# Patient Record
Sex: Female | Born: 1985 | Race: Black or African American | Hispanic: No | Marital: Married | Smoking: Never smoker
Health system: Southern US, Community
[De-identification: ages and names within clinical notes are randomized; demographics above are authoritative.]

## PROBLEM LIST (undated history)

## (undated) ENCOUNTER — Inpatient Hospital Stay (HOSPITAL_COMMUNITY): Payer: Self-pay

## (undated) DIAGNOSIS — R519 Headache, unspecified: Secondary | ICD-10-CM

## (undated) DIAGNOSIS — R51 Headache: Secondary | ICD-10-CM

## (undated) DIAGNOSIS — J069 Acute upper respiratory infection, unspecified: Secondary | ICD-10-CM

## (undated) DIAGNOSIS — O139 Gestational [pregnancy-induced] hypertension without significant proteinuria, unspecified trimester: Secondary | ICD-10-CM

## (undated) HISTORY — DX: Headache, unspecified: R51.9

## (undated) HISTORY — DX: Headache: R51

## (undated) HISTORY — PX: NO PAST SURGERIES: SHX2092

## (undated) HISTORY — DX: Acute upper respiratory infection, unspecified: J06.9

---

## 2014-09-30 ENCOUNTER — Other Ambulatory Visit: Payer: Self-pay | Admitting: Family Medicine

## 2014-09-30 DIAGNOSIS — R1011 Right upper quadrant pain: Secondary | ICD-10-CM

## 2014-10-07 ENCOUNTER — Ambulatory Visit
Admission: RE | Admit: 2014-10-07 | Discharge: 2014-10-07 | Disposition: A | Discharge status: Home / Self Care | Payer: BLUE CROSS/BLUE SHIELD | Source: Ambulatory Visit | Attending: Family Medicine | Admitting: Family Medicine

## 2014-10-07 DIAGNOSIS — R1011 Right upper quadrant pain: Secondary | ICD-10-CM

## 2016-05-09 NOTE — L&D Delivery Note (Signed)
Patient is 31 y.o. G1P1001 [redacted]w[redacted]d admitted for IOL for gHTN. S/p IOL with foley bulb, cytotec, followed by Pitocin. SROM at 0800.  Delivery Note At 1:26 PM a viable female was delivered via Vaginal, Spontaneous Delivery (Presentation: OA ).  APGAR: 4, 7; weight 6 lb 14.2 oz (3125 g).   Placenta status: Intact .  Cord: 3V  with the following complications: None.  Cord pH: 7.2.  Anesthesia: Epidural  Episiotomy: None Lacerations: None Est. Blood Loss (mL): 150  Mom to postpartum.  Baby to Couplet care / Skin to Skin.  Upon arrival patient was complete and pushing. She pushed with good maternal effort to deliver a viable infant. No nuchal cord present. Baby delivered without difficulty. Was noted to have poor tone, color,a nd decreased respirations. Was taken over to warmer for drying and stimulation. Placenta delivered spontaneously with gentle cord traction. Fundus firm with massage and Pitocin. Perineum inspected and found to be hemostatic. Counts of sharps, instruments, and lap pads were all correct.   Caryl Ada, DO OB Fellow 01/27/2017, 1:53 PM

## 2016-07-07 ENCOUNTER — Other Ambulatory Visit (HOSPITAL_COMMUNITY)
Admission: RE | Admit: 2016-07-07 | Discharge: 2016-07-07 | Disposition: A | Discharge status: Home / Self Care | Payer: Medicaid Other | Source: Ambulatory Visit | Attending: Obstetrics and Gynecology | Admitting: Obstetrics and Gynecology

## 2016-07-07 ENCOUNTER — Encounter: Payer: Self-pay | Admitting: Obstetrics and Gynecology

## 2016-07-07 ENCOUNTER — Ambulatory Visit (INDEPENDENT_AMBULATORY_CARE_PROVIDER_SITE_OTHER): Payer: Medicaid Other | Admitting: Obstetrics and Gynecology

## 2016-07-07 DIAGNOSIS — Z113 Encounter for screening for infections with a predominantly sexual mode of transmission: Secondary | ICD-10-CM | POA: Diagnosis present

## 2016-07-07 DIAGNOSIS — Z01419 Encounter for gynecological examination (general) (routine) without abnormal findings: Secondary | ICD-10-CM | POA: Diagnosis not present

## 2016-07-07 DIAGNOSIS — Z3401 Encounter for supervision of normal first pregnancy, first trimester: Secondary | ICD-10-CM

## 2016-07-07 NOTE — Patient Instructions (Signed)
First Trimester of Pregnancy The first trimester of pregnancy is from week 1 until the end of week 13 (months 1 through 3). A week after a sperm fertilizes an egg, the egg will implant on the wall of the uterus. This embryo will begin to develop into a baby. Genes from you and your partner will form the baby. The female genes will determine whether the baby will be a boy or a girl. At 6-8 weeks, the eyes and face will be formed, and the heartbeat can be seen on ultrasound. At the end of 12 weeks, all the baby's organs will be formed. Now that you are pregnant, you will want to do everything you can to have a healthy baby. Two of the most important things are to get good prenatal care and to follow your health care provider's instructions. Prenatal care is all the medical care you receive before the baby's birth. This care will help prevent, find, and treat any problems during the pregnancy and childbirth. Body changes during your first trimester Your body goes through many changes during pregnancy. The changes vary from woman to woman.  You may gain or lose a couple of pounds at first.  You may feel sick to your stomach (nauseous) and you may throw up (vomit). If the vomiting is uncontrollable, call your health care provider.  You may tire easily.  You may develop headaches that can be relieved by medicines. All medicines should be approved by your health care provider.  You may urinate more often. Painful urination may mean you have a bladder infection.  You may develop heartburn as a result of your pregnancy.  You may develop constipation because certain hormones are causing the muscles that push stool through your intestines to slow down.  You may develop hemorrhoids or swollen veins (varicose veins).  Your breasts may begin to grow larger and become tender. Your nipples may stick out more, and the tissue that surrounds them (areola) may become darker.  Your gums may bleed and may be  sensitive to brushing and flossing.  Dark spots or blotches (chloasma, mask of pregnancy) may develop on your face. This will likely fade after the baby is born.  Your menstrual periods will stop.  You may have a loss of appetite.  You may develop cravings for certain kinds of food.  You may have changes in your emotions from day to day, such as being excited to be pregnant or being concerned that something may go wrong with the pregnancy and baby.  You may have more vivid and strange dreams.  You may have changes in your hair. These can include thickening of your hair, rapid growth, and changes in texture. Some women also have hair loss during or after pregnancy, or hair that feels dry or thin. Your hair will most likely return to normal after your baby is born.  What to expect at prenatal visits During a routine prenatal visit:  You will be weighed to make sure you and the baby are growing normally.  Your blood pressure will be taken.  Your abdomen will be measured to track your baby's growth.  The fetal heartbeat will be listened to between weeks 10 and 14 of your pregnancy.  Test results from any previous visits will be discussed.  Your health care provider may ask you:  How you are feeling.  If you are feeling the baby move.  If you have had any abnormal symptoms, such as leaking fluid, bleeding, severe headaches,   or abdominal cramping.  If you are using any tobacco products, including cigarettes, chewing tobacco, and electronic cigarettes.  If you have any questions.  Other tests that may be performed during your first trimester include:  Blood tests to find your blood type and to check for the presence of any previous infections. The tests will also be used to check for low iron levels (anemia) and protein on red blood cells (Rh antibodies). Depending on your risk factors, or if you previously had diabetes during pregnancy, you may have tests to check for high blood  sugar that affects pregnant women (gestational diabetes).  Urine tests to check for infections, diabetes, or protein in the urine.  An ultrasound to confirm the proper growth and development of the baby.  Fetal screens for spinal cord problems (spina bifida) and Down syndrome.  HIV (human immunodeficiency virus) testing. Routine prenatal testing includes screening for HIV, unless you choose not to have this test.  You may need other tests to make sure you and the baby are doing well.  Follow these instructions at home: Medicines  Follow your health care provider's instructions regarding medicine use. Specific medicines may be either safe or unsafe to take during pregnancy.  Take a prenatal vitamin that contains at least 600 micrograms (mcg) of folic acid.  If you develop constipation, try taking a stool softener if your health care provider approves. Eating and drinking  Eat a balanced diet that includes fresh fruits and vegetables, whole grains, good sources of protein such as meat, eggs, or tofu, and low-fat dairy. Your health care provider will help you determine the amount of weight gain that is right for you.  Avoid raw meat and uncooked cheese. These carry germs that can cause birth defects in the baby.  Eating four or five small meals rather than three large meals a day may help relieve nausea and vomiting. If you start to feel nauseous, eating a few soda crackers can be helpful. Drinking liquids between meals, instead of during meals, also seems to help ease nausea and vomiting.  Limit foods that are high in fat and processed sugars, such as fried and sweet foods.  To prevent constipation: ? Eat foods that are high in fiber, such as fresh fruits and vegetables, whole grains, and beans. ? Drink enough fluid to keep your urine clear or pale yellow. Activity  Exercise only as directed by your health care provider. Most women can continue their usual exercise routine during  pregnancy. Try to exercise for 30 minutes at least 5 days a week. Exercising will help you: ? Control your weight. ? Stay in shape. ? Be prepared for labor and delivery.  Experiencing pain or cramping in the lower abdomen or lower back is a good sign that you should stop exercising. Check with your health care provider before continuing with normal exercises.  Try to avoid standing for long periods of time. Move your legs often if you must stand in one place for a long time.  Avoid heavy lifting.  Wear low-heeled shoes and practice good posture.  You may continue to have sex unless your health care provider tells you not to. Relieving pain and discomfort  Wear a good support bra to relieve breast tenderness.  Take warm sitz baths to soothe any pain or discomfort caused by hemorrhoids. Use hemorrhoid cream if your health care provider approves.  Rest with your legs elevated if you have leg cramps or low back pain.  If you develop   varicose veins in your legs, wear support hose. Elevate your feet for 15 minutes, 3-4 times a day. Limit salt in your diet. Prenatal care  Schedule your prenatal visits by the twelfth week of pregnancy. They are usually scheduled monthly at first, then more often in the last 2 months before delivery.  Write down your questions. Take them to your prenatal visits.  Keep all your prenatal visits as told by your health care provider. This is important. Safety  Wear your seat belt at all times when driving.  Make a list of emergency phone numbers, including numbers for family, friends, the hospital, and police and fire departments. General instructions  Ask your health care provider for a referral to a local prenatal education class. Begin classes no later than the beginning of month 6 of your pregnancy.  Ask for help if you have counseling or nutritional needs during pregnancy. Your health care provider can offer advice or refer you to specialists for help  with various needs.  Do not use hot tubs, steam rooms, or saunas.  Do not douche or use tampons or scented sanitary pads.  Do not cross your legs for long periods of time.  Avoid cat litter boxes and soil used by cats. These carry germs that can cause birth defects in the baby and possibly loss of the fetus by miscarriage or stillbirth.  Avoid all smoking, herbs, alcohol, and medicines not prescribed by your health care provider. Chemicals in these products affect the formation and growth of the baby.  Do not use any products that contain nicotine or tobacco, such as cigarettes and e-cigarettes. If you need help quitting, ask your health care provider. You may receive counseling support and other resources to help you quit.  Schedule a dentist appointment. At home, brush your teeth with a soft toothbrush and be gentle when you floss. Contact a health care provider if:  You have dizziness.  You have mild pelvic cramps, pelvic pressure, or nagging pain in the abdominal area.  You have persistent nausea, vomiting, or diarrhea.  You have a bad smelling vaginal discharge.  You have pain when you urinate.  You notice increased swelling in your face, hands, legs, or ankles.  You are exposed to fifth disease or chickenpox.  You are exposed to German measles (rubella) and have never had it. Get help right away if:  You have a fever.  You are leaking fluid from your vagina.  You have spotting or bleeding from your vagina.  You have severe abdominal cramping or pain.  You have rapid weight gain or loss.  You vomit blood or material that looks like coffee grounds.  You develop a severe headache.  You have shortness of breath.  You have any kind of trauma, such as from a fall or a car accident. Summary  The first trimester of pregnancy is from week 1 until the end of week 13 (months 1 through 3).  Your body goes through many changes during pregnancy. The changes vary from  woman to woman.  You will have routine prenatal visits. During those visits, your health care provider will examine you, discuss any test results you may have, and talk with you about how you are feeling. This information is not intended to replace advice given to you by your health care provider. Make sure you discuss any questions you have with your health care provider. Document Released: 04/19/2001 Document Revised: 04/06/2016 Document Reviewed: 04/06/2016 Elsevier Interactive Patient Education  2017 [employer]   [employer]  

## 2016-07-07 NOTE — Progress Notes (Signed)
Subjective:  Cassandra Avery is a 31 y.o. G1P0 at 875w4d being seen today for her first OB visit. She has no complaints today. She denies any chronic medical problems. This is her first pregnancy. She is currently monitored for the following issues for this low-risk pregnancy and has Supervision of normal first pregnancy in first trimester on her problem list.  Patient reports no complaints.  Contractions: Not present. Vag. Bleeding: None.   . Denies leaking of fluid.   The following portions of the patient's history were reviewed and updated as appropriate: allergies, current medications, past family history, past medical history, past social history, past surgical history and problem list. Problem list updated.  Objective:   Vitals:   07/07/16 1341 07/07/16 1344  BP: 121/80   Pulse: 84   Temp: 98.1 F (36.7 C)   Weight: 208 lb (94.3 kg)   Height:  5\' 2"  (1.575 m)    Fetal Status: Fetal Heart Rate (bpm): 154         General:  Alert, oriented and cooperative. Patient is in no acute distress.  Skin: Skin is warm and dry. No rash noted.   Cardiovascular: Normal heart rate noted  Respiratory: Normal respiratory effort, no problems with respiration noted  Abdomen: Soft, gravid, appropriate for gestational age. Pain/Pressure: Absent     Pelvic:  Cervical exam performed        Extremities: Normal range of motion.     Mental Status: Normal mood and affect. Normal behavior. Normal judgment and thought content.  Breast Sym supple no nipple d/c, adenopathy or masses  Urinalysis: Urine Protein: Negative Urine Glucose: Negative  Assessment and Plan:  Pregnancy: G1P0 at 5775w4d  1. Supervision of normal first pregnancy in first trimester Prenatal care and labs reviewed. Declined flu vaccine First trimester screening reviewed, declined - HIV antibody - Hemoglobinopathy evaluation - Varicella zoster antibody, IgG - Prenatal Profile I - Culture, OB Urine - Cytology - PAP  Preterm labor  symptoms and general obstetric precautions including but not limited to vaginal bleeding, contractions, leaking of fluid and fetal movement were reviewed in detail with the patient. Please refer to After Visit Summary for other counseling recommendations.  No Follow-up on file.   Hermina StaggersMichael L Modesty Rudy, MD

## 2016-07-10 LAB — CULTURE, OB URINE

## 2016-07-10 LAB — URINE CULTURE, OB REFLEX

## 2016-07-11 LAB — HEMOGLOBINOPATHY EVALUATION
HEMOGLOBIN A2 QUANTITATION: 2.5 % (ref 1.8–3.2)
HGB C: 0 %
HGB S: 0 %
HGB VARIANT: 0 %
Hemoglobin F Quantitation: 0 % (ref 0.0–2.0)
Hgb A: 97.5 % (ref 96.4–98.8)

## 2016-07-11 LAB — PRENATAL PROFILE I(LABCORP)
Antibody Screen: NEGATIVE
Basophils Absolute: 0 10*3/uL (ref 0.0–0.2)
Basos: 0 %
EOS (ABSOLUTE): 0.1 10*3/uL (ref 0.0–0.4)
EOS: 1 %
HEMOGLOBIN: 11.5 g/dL (ref 11.1–15.9)
HEP B S AG: NEGATIVE
Hematocrit: 35.8 % (ref 34.0–46.6)
IMMATURE GRANS (ABS): 0 10*3/uL (ref 0.0–0.1)
IMMATURE GRANULOCYTES: 0 %
Lymphocytes Absolute: 1.8 10*3/uL (ref 0.7–3.1)
Lymphs: 23 %
MCH: 29 pg (ref 26.6–33.0)
MCHC: 32.1 g/dL (ref 31.5–35.7)
MCV: 90 fL (ref 79–97)
MONOCYTES: 9 %
Monocytes Absolute: 0.7 10*3/uL (ref 0.1–0.9)
NEUTROS PCT: 67 %
Neutrophils Absolute: 5.4 10*3/uL (ref 1.4–7.0)
Platelets: 290 10*3/uL (ref 150–379)
RBC: 3.96 x10E6/uL (ref 3.77–5.28)
RDW: 14.2 % (ref 12.3–15.4)
RPR Ser Ql: NONREACTIVE
RUBELLA: 10.9 {index} (ref >0.99)
Rh Factor: POSITIVE
WBC: 8.1 10*3/uL (ref 3.4–10.8)

## 2016-07-11 LAB — CYTOLOGY - PAP
CHLAMYDIA, DNA PROBE: NEGATIVE
Diagnosis: NEGATIVE
Neisseria Gonorrhea: NEGATIVE

## 2016-07-11 LAB — VARICELLA ZOSTER ANTIBODY, IGG: VARICELLA: 2331 {index} (ref >165)

## 2016-07-11 LAB — HIV ANTIBODY (ROUTINE TESTING W REFLEX): HIV Screen 4th Generation wRfx: NONREACTIVE

## 2016-07-12 ENCOUNTER — Other Ambulatory Visit: Payer: Self-pay | Admitting: *Deleted

## 2016-07-12 ENCOUNTER — Telehealth: Payer: Self-pay

## 2016-07-12 DIAGNOSIS — N3000 Acute cystitis without hematuria: Secondary | ICD-10-CM

## 2016-07-12 MED ORDER — CEPHALEXIN 500 MG PO CAPS: 500.0000 mg | ORAL_CAPSULE | Freq: Three times a day (TID) | ORAL | 0 refills | Status: DC

## 2016-07-12 NOTE — Telephone Encounter (Signed)
Patient called, advised of results and rx sent. 

## 2016-07-12 NOTE — Progress Notes (Unsigned)
Keflex ordered to pt pharmacy per Dr Alysia PennaErvin order.

## 2016-07-19 ENCOUNTER — Other Ambulatory Visit: Payer: Self-pay | Admitting: Certified Nurse Midwife

## 2016-07-19 DIAGNOSIS — O2341 Unspecified infection of urinary tract in pregnancy, first trimester: Secondary | ICD-10-CM

## 2016-07-19 DIAGNOSIS — O219 Vomiting of pregnancy, unspecified: Secondary | ICD-10-CM

## 2016-07-19 MED ORDER — AMOXICILLIN-POT CLAVULANATE 875-125 MG PO TABS: 1.0000 | ORAL_TABLET | Freq: Two times a day (BID) | ORAL | 0 refills | Status: DC

## 2016-07-19 MED ORDER — PROMETHAZINE HCL 12.5 MG PO TABS: 12.5000 mg | ORAL_TABLET | Freq: Four times a day (QID) | ORAL | 0 refills | Status: DC | PRN

## 2016-07-20 ENCOUNTER — Encounter: Payer: Medicaid Other | Admitting: Obstetrics & Gynecology

## 2016-08-04 ENCOUNTER — Ambulatory Visit (INDEPENDENT_AMBULATORY_CARE_PROVIDER_SITE_OTHER): Payer: Medicaid Other | Admitting: Obstetrics & Gynecology

## 2016-08-04 DIAGNOSIS — Z3401 Encounter for supervision of normal first pregnancy, first trimester: Secondary | ICD-10-CM

## 2016-08-04 MED ORDER — DOCUSATE SODIUM 100 MG PO CAPS: 100.0000 mg | ORAL_CAPSULE | Freq: Two times a day (BID) | ORAL | 2 refills | Status: DC | PRN

## 2016-08-04 NOTE — Patient Instructions (Signed)

## 2016-08-04 NOTE — Progress Notes (Signed)
   PRENATAL VISIT NOTE  Subjective:  Cassandra Avery is a 31 y.o. G1P0 at 4086w4d being seen today for ongoing prenatal care.  She is currently monitored for the following issues for this low-risk pregnancy and has Supervision of normal first pregnancy in first trimester on her problem list.  Patient reports nausea and constipation.  Contractions: Regular. Vag. Bleeding: Small.   . Denies leaking of fluid.   The following portions of the patient's history were reviewed and updated as appropriate: allergies, current medications, past family history, past medical history, past social history, past surgical history and problem list. Problem list updated.  Objective:   Vitals:   08/04/16 1026  BP: 121/80  Pulse: 81  Weight: 202 lb (91.6 kg)    Fetal Status: Fetal Heart Rate (bpm): 154         General:  Alert, oriented and cooperative. Patient is in no acute distress.  Skin: Skin is warm and dry. No rash noted.   Cardiovascular: Normal heart rate noted  Respiratory: Normal respiratory effort, no problems with respiration noted  Abdomen: Soft, gravid, appropriate for gestational age. Pain/Pressure: Absent     Pelvic:  Cervical exam deferred        Extremities: Normal range of motion.  Edema: None  Mental Status: Normal mood and affect. Normal behavior. Normal judgment and thought content.   Assessment and Plan:  Pregnancy: G1P0 at 6086w4d  1. Supervision of normal first pregnancy in first trimester constipation - US MFM OB COMP + 14 WK; Future - docusate sodium (COLACE) 100 MG capsule; Take 1 capsule (100 mg total) by mouth 2 (two) times daily as needed.  Dispense: 30 capsule; Refill: 2  Preterm labor symptoms and general obstetric precautions including but not limited to vaginal bleeding, contractions, leaking of fluid and fetal movement were reviewed in detail with the patient. Please refer to After Visit Summary for other counseling recommendations.  Return in about 4 weeks (around  09/01/2016).   Adam PhenixJames G Torrie Lafavor, MD

## 2016-09-01 ENCOUNTER — Ambulatory Visit (INDEPENDENT_AMBULATORY_CARE_PROVIDER_SITE_OTHER): Payer: Medicaid Other | Admitting: Obstetrics and Gynecology

## 2016-09-01 ENCOUNTER — Encounter: Payer: Medicaid Other | Admitting: Obstetrics and Gynecology

## 2016-09-01 ENCOUNTER — Ambulatory Visit (HOSPITAL_COMMUNITY)
Admission: RE | Admit: 2016-09-01 | Discharge: 2016-09-01 | Disposition: A | Discharge status: Home / Self Care | Payer: Medicaid Other | Source: Ambulatory Visit | Attending: Obstetrics & Gynecology | Admitting: Obstetrics & Gynecology

## 2016-09-01 ENCOUNTER — Other Ambulatory Visit: Payer: Self-pay | Admitting: Obstetrics & Gynecology

## 2016-09-01 DIAGNOSIS — Z3482 Encounter for supervision of other normal pregnancy, second trimester: Secondary | ICD-10-CM

## 2016-09-01 DIAGNOSIS — Z3A18 18 weeks gestation of pregnancy: Secondary | ICD-10-CM | POA: Diagnosis not present

## 2016-09-01 DIAGNOSIS — Z3401 Encounter for supervision of normal first pregnancy, first trimester: Secondary | ICD-10-CM | POA: Diagnosis present

## 2016-09-01 DIAGNOSIS — Z3490 Encounter for supervision of normal pregnancy, unspecified, unspecified trimester: Secondary | ICD-10-CM | POA: Insufficient documentation

## 2016-09-01 DIAGNOSIS — O99212 Obesity complicating pregnancy, second trimester: Secondary | ICD-10-CM | POA: Insufficient documentation

## 2016-09-01 DIAGNOSIS — E669 Obesity, unspecified: Secondary | ICD-10-CM | POA: Diagnosis not present

## 2016-09-01 DIAGNOSIS — Z6838 Body mass index (BMI) 38.0-38.9, adult: Secondary | ICD-10-CM | POA: Insufficient documentation

## 2016-09-01 DIAGNOSIS — Z3400 Encounter for supervision of normal first pregnancy, unspecified trimester: Secondary | ICD-10-CM

## 2016-09-01 NOTE — Progress Notes (Signed)
Patient reports feeling fetal flutter movements, denies pain/contractions and bleeding.

## 2016-09-01 NOTE — Progress Notes (Signed)
Subjective:  Cassandra Avery is a 31 y.o. G1P0 at [redacted]w[redacted]d being seen today for ongoing prenatal care.  She is currently monitored for the following issues for this low-risk pregnancy and has Supervision of normal pregnancy on her problem list.  Patient reports no complaints.  Contractions: Not present. Vag. Bleeding: None.  Movement: Present. Denies leaking of fluid.   The following portions of the patient's history were reviewed and updated as appropriate: allergies, current medications, past family history, past medical history, past social history, past surgical history and problem list. Problem list updated.  Objective:   Vitals:   09/01/16 1028  BP: 110/74  Pulse: 88  Weight: 208 lb 1.6 oz (94.4 kg)    Fetal Status: Fetal Heart Rate (bpm): 152   Movement: Present     General:  Alert, oriented and cooperative. Patient is in no acute distress.  Skin: Skin is warm and dry. No rash noted.   Cardiovascular: Normal heart rate noted  Respiratory: Normal respiratory effort, no problems with respiration noted  Abdomen: Soft, gravid, appropriate for gestational age. Pain/Pressure: Absent     Pelvic:  Cervical exam deferred        Extremities: Normal range of motion.  Edema: Trace  Mental Status: Normal mood and affect. Normal behavior. Normal judgment and thought content.   Urinalysis:      Assessment and Plan:  Pregnancy: G1P0 at [redacted]w[redacted]d  1. Supervision of normal first pregnancy, antepartum AFP and anatomy scan today  Preterm labor symptoms and general obstetric precautions including but not limited to vaginal bleeding, contractions, leaking of fluid and fetal movement were reviewed in detail with the patient. Please refer to After Visit Summary for other counseling recommendations.  Return in about 2 weeks (around 09/15/2016).   Hermina Staggers, MD

## 2016-09-01 NOTE — Patient Instructions (Signed)

## 2016-09-06 LAB — AFP, QUAD SCREEN
DIA MOM VALUE: 0.75
DIA VALUE (EIA): 106.46 pg/mL
DSR (By Age)    1 IN: 797
DSR (Second Trimester) 1 IN: 10000
GESTATIONAL AGE AFP: 18.6 wk
MATERNAL AGE AT EDD: 28.8 a
MSAFP Mom: 1.29
MSAFP: 53.7 ng/mL
MSHCG MOM: 1.49
MSHCG: 31090 m[IU]/mL
Osb Risk: 9894
Test Results:: NEGATIVE
UE3 MOM: 1.35
Weight: 208 [lb_av]
uE3 Value: 1.77 ng/mL

## 2016-09-15 ENCOUNTER — Ambulatory Visit (INDEPENDENT_AMBULATORY_CARE_PROVIDER_SITE_OTHER): Payer: Medicaid Other | Admitting: Obstetrics and Gynecology

## 2016-09-15 VITALS — BP 133/83 | HR 105 | Wt 211.0 lb

## 2016-09-15 DIAGNOSIS — Z3402 Encounter for supervision of normal first pregnancy, second trimester: Secondary | ICD-10-CM

## 2016-09-15 NOTE — Progress Notes (Signed)
   PRENATAL VISIT NOTE  Subjective:  Cassandra Avery is a 31 y.o. G1P0 at 6668w4d being seen today for ongoing prenatal care.  She is currently monitored for the following issues for this low-risk pregnancy and has Supervision of normal pregnancy on her problem list.  Patient reports no complaints.  Contractions: Not present. Vag. Bleeding: None.  Movement: Present. Denies leaking of fluid.   The following portions of the patient's history were reviewed and updated as appropriate: allergies, current medications, past family history, past medical history, past social history, past surgical history and problem list. Problem list updated.  Objective:   Vitals:   09/15/16 1314  BP: 133/83  Pulse: (!) 105  Weight: 211 lb (95.7 kg)    Fetal Status: Fetal Heart Rate (bpm): 145 Fundal Height: 22 cm Movement: Present     General:  Alert, oriented and cooperative. Patient is in no acute distress.  Skin: Skin is warm and dry. No rash noted.   Cardiovascular: Normal heart rate noted  Respiratory: Normal respiratory effort, no problems with respiration noted  Abdomen: Soft, gravid, appropriate for gestational age. Pain/Pressure: Absent     Pelvic:  Cervical exam deferred        Extremities: Normal range of motion.  Edema: None  Mental Status: Normal mood and affect. Normal behavior. Normal judgment and thought content.   Assessment and Plan:  Pregnancy: G1P0 at 5668w4d  1. Encounter for supervision of normal first pregnancy in second trimester Patient is doing well without complaints Anatomy ultrasound reviewed and follow up ordered - US MFM OB FOLLOW UP; Future  Preterm labor symptoms and general obstetric precautions including but not limited to vaginal bleeding, contractions, leaking of fluid and fetal movement were reviewed in detail with the patient. Please refer to After Visit Summary for other counseling recommendations.  Return in about 4 weeks (around 10/13/2016) for ROB.   Legend Tumminello,  Gigi GinPeggy, MD

## 2016-09-15 NOTE — Progress Notes (Signed)
Patient reports no concerns today 

## 2016-10-11 ENCOUNTER — Ambulatory Visit (HOSPITAL_COMMUNITY)
Admission: RE | Admit: 2016-10-11 | Discharge: 2016-10-11 | Disposition: A | Discharge status: Home / Self Care | Payer: Medicaid Other | Source: Ambulatory Visit | Attending: Obstetrics and Gynecology | Admitting: Obstetrics and Gynecology

## 2016-10-11 ENCOUNTER — Other Ambulatory Visit: Payer: Self-pay | Admitting: Obstetrics and Gynecology

## 2016-10-11 DIAGNOSIS — O99212 Obesity complicating pregnancy, second trimester: Secondary | ICD-10-CM

## 2016-10-11 DIAGNOSIS — Z3A24 24 weeks gestation of pregnancy: Secondary | ICD-10-CM

## 2016-10-11 DIAGNOSIS — Z3402 Encounter for supervision of normal first pregnancy, second trimester: Secondary | ICD-10-CM

## 2016-10-11 DIAGNOSIS — Z362 Encounter for other antenatal screening follow-up: Secondary | ICD-10-CM

## 2016-10-13 ENCOUNTER — Ambulatory Visit (INDEPENDENT_AMBULATORY_CARE_PROVIDER_SITE_OTHER): Payer: Medicaid Other | Admitting: Obstetrics and Gynecology

## 2016-10-13 VITALS — BP 118/81 | HR 97 | Wt 210.0 lb

## 2016-10-13 DIAGNOSIS — Z3402 Encounter for supervision of normal first pregnancy, second trimester: Secondary | ICD-10-CM

## 2016-10-13 LAB — POCT URINALYSIS DIPSTICK
BILIRUBIN UA: NEGATIVE
Blood, UA: NEGATIVE
Glucose, UA: NEGATIVE
NITRITE UA: NEGATIVE
PH UA: 7.5 (ref 5.0–8.0)
Protein, UA: NEGATIVE
Spec Grav, UA: <=1.005 — AB (ref 1.010–1.025)
Urobilinogen, UA: >=8 E.U./dL — AB

## 2016-10-13 NOTE — Progress Notes (Signed)
   PRENATAL VISIT NOTE  Subjective:  Cassandra Avery is a 31 y.o. G1P0 at 5880w4d being seen today for ongoing prenatal care.  She is currently monitored for the following issues for this low-risk pregnancy and has Supervision of normal pregnancy on her problem list.  Patient reports no complaints.  Contractions: Not present. Vag. Bleeding: None.  Movement: Present. Denies leaking of fluid.   The following portions of the patient's history were reviewed and updated as appropriate: allergies, current medications, past family history, past medical history, past social history, past surgical history and problem list. Problem list updated.  Objective:   Vitals:   10/13/16 0902  BP: 118/81  Pulse: 97  Weight: 210 lb (95.3 kg)    Fetal Status: Fetal Heart Rate (bpm): 143 Fundal Height: 24 cm Movement: Present     General:  Alert, oriented and cooperative. Patient is in no acute distress.  Skin: Skin is warm and dry. No rash noted.   Cardiovascular: Normal heart rate noted  Respiratory: Normal respiratory effort, no problems with respiration noted  Abdomen: Soft, gravid, appropriate for gestational age. Pain/Pressure: Absent     Pelvic:  Cervical exam deferred        Extremities: Normal range of motion.     Mental Status: Normal mood and affect. Normal behavior. Normal judgment and thought content.   Assessment and Plan:  Pregnancy: G1P0 at 180w4d  1. Encounter for supervision of normal first pregnancy in second trimester Patient is doing well without complaints Reviewed recent ultrasound resutls Third trimester labs and glucola next visit  Preterm labor symptoms and general obstetric precautions including but not limited to vaginal bleeding, contractions, leaking of fluid and fetal movement were reviewed in detail with the patient. Please refer to After Visit Summary for other counseling recommendations.  Return in about 3 weeks (around 11/03/2016) for ROB, 2 hr glucola next  visit.   Catalina AntiguaPeggy Merric Yost, MD

## 2016-10-13 NOTE — Addendum Note (Signed)
Addended by: Marya LandryFOSTER, Jocilyn Trego on: 10/13/2016 09:21 AM   Modules accepted: Orders

## 2016-11-03 ENCOUNTER — Other Ambulatory Visit: Payer: Medicaid Other

## 2016-11-03 ENCOUNTER — Ambulatory Visit (INDEPENDENT_AMBULATORY_CARE_PROVIDER_SITE_OTHER): Payer: Medicaid Other | Admitting: Obstetrics and Gynecology

## 2016-11-03 DIAGNOSIS — Z3402 Encounter for supervision of normal first pregnancy, second trimester: Secondary | ICD-10-CM

## 2016-11-03 MED ORDER — COMFORT FIT MATERNITY SUPP MED MISC: 0 refills | Status: DC

## 2016-11-03 NOTE — Progress Notes (Signed)
   PRENATAL VISIT NOTE  Subjective:  Cassandra Avery is a 31 y.o. G1P0 at 311w4d being seen today for ongoing prenatal care.  She is currently monitored for the following issues for this high-risk pregnancy and has Supervision of normal pregnancy on her problem list.  Patient reports no complaints.  Contractions: Not present. Vag. Bleeding: None.  Movement: Present. Denies leaking of fluid.   The following portions of the patient's history were reviewed and updated as appropriate: allergies, current medications, past family history, past medical history, past social history, past surgical history and problem list. Problem list updated.  Objective:   Vitals:   11/03/16 0922  BP: 122/85  Pulse: 97  Weight: 213 lb (96.6 kg)    Fetal Status: Fetal Heart Rate (bpm): 135   Movement: Present     General:  Alert, oriented and cooperative. Patient is in no acute distress.  Skin: Skin is warm and dry. No rash noted.   Cardiovascular: Normal heart rate noted  Respiratory: Normal respiratory effort, no problems with respiration noted  Abdomen: Soft, gravid, appropriate for gestational age. Pain/Pressure: Absent     Pelvic:  Cervical exam deferred        Extremities: Normal range of motion.  Edema: None  Mental Status: Normal mood and affect. Normal behavior. Normal judgment and thought content.   Assessment and Plan:  Pregnancy: G1P0 at 6011w4d  1. Encounter for supervision of normal first pregnancy in second trimester Patient is doing well without complaints Third trimester labs and glucola today. tdap next visit Rx maternity support belt provided.  Patient complaining of right wrist pain- Advised the use of wrist brace at bedtime - Glucose Tolerance, 2 Hours w/1 Hour - CBC - HIV antibody (with reflex) - RPR  Preterm labor symptoms and general obstetric precautions including but not limited to vaginal bleeding, contractions, leaking of fluid and fetal movement were reviewed in detail with  the patient. Please refer to After Visit Summary for other counseling recommendations.  No Follow-up on file.   Catalina AntiguaPeggy Alayshia Marini, MD

## 2016-11-04 LAB — GLUCOSE TOLERANCE, 2 HOURS W/ 1HR
GLUCOSE, 1 HOUR: 80 mg/dL (ref 65–179)
GLUCOSE, 2 HOUR: 88 mg/dL (ref 65–152)
Glucose, Fasting: 71 mg/dL (ref 65–91)

## 2016-11-04 LAB — CBC
HEMATOCRIT: 31.9 % — AB (ref 34.0–46.6)
HEMOGLOBIN: 10.4 g/dL — AB (ref 11.1–15.9)
MCH: 29.3 pg (ref 26.6–33.0)
MCHC: 32.6 g/dL (ref 31.5–35.7)
MCV: 90 fL (ref 79–97)
Platelets: 269 10*3/uL (ref 150–379)
RBC: 3.55 x10E6/uL — ABNORMAL LOW (ref 3.77–5.28)
RDW: 14.8 % (ref 12.3–15.4)
WBC: 8.9 10*3/uL (ref 3.4–10.8)

## 2016-11-04 LAB — RPR: RPR Ser Ql: NONREACTIVE

## 2016-11-04 LAB — HIV ANTIBODY (ROUTINE TESTING W REFLEX): HIV Screen 4th Generation wRfx: NONREACTIVE

## 2016-11-16 ENCOUNTER — Ambulatory Visit (INDEPENDENT_AMBULATORY_CARE_PROVIDER_SITE_OTHER): Payer: Medicaid Other | Admitting: Obstetrics & Gynecology

## 2016-11-16 DIAGNOSIS — Z3402 Encounter for supervision of normal first pregnancy, second trimester: Secondary | ICD-10-CM

## 2016-11-16 DIAGNOSIS — Z3403 Encounter for supervision of normal first pregnancy, third trimester: Secondary | ICD-10-CM

## 2016-11-16 NOTE — Patient Instructions (Signed)
Third Trimester of Pregnancy The third trimester is from week 28 through week 40 (months 7 through 9). The third trimester is a time when the unborn baby (fetus) is growing rapidly. At the end of the ninth month, the fetus is about 20 inches in length and weighs 6-10 pounds. Body changes during your third trimester Your body will continue to go through many changes during pregnancy. The changes vary from woman to woman. During the third trimester:  Your weight will continue to increase. You can expect to gain 25-35 pounds (11-16 kg) by the end of the pregnancy.  You may begin to get stretch marks on your hips, abdomen, and breasts.  You may urinate more often because the fetus is moving lower into your pelvis and pressing on your bladder.  You may develop or continue to have heartburn. This is caused by increased hormones that slow down muscles in the digestive tract.  You may develop or continue to have constipation because increased hormones slow digestion and cause the muscles that push waste through your intestines to relax.  You may develop hemorrhoids. These are swollen veins (varicose veins) in the rectum that can itch or be painful.  You may develop swollen, bulging veins (varicose veins) in your legs.  You may have increased body aches in the pelvis, back, or thighs. This is due to weight gain and increased hormones that are relaxing your joints.  You may have changes in your hair. These can include thickening of your hair, rapid growth, and changes in texture. Some women also have hair loss during or after pregnancy, or hair that feels dry or thin. Your hair will most likely return to normal after your baby is born.  Your breasts will continue to grow and they will continue to become tender. A yellow fluid (colostrum) may leak from your breasts. This is the first milk you are producing for your baby.  Your belly button may stick out.  You may notice more swelling in your hands,  face, or ankles.  You may have increased tingling or numbness in your hands, arms, and legs. The skin on your belly may also feel numb.  You may feel short of breath because of your expanding uterus.  You may have more problems sleeping. This can be caused by the size of your belly, increased need to urinate, and an increase in your body's metabolism.  You may notice the fetus "dropping," or moving lower in your abdomen (lightening).  You may have increased vaginal discharge.  You may notice your joints feel loose and you may have pain around your pelvic bone.  What to expect at prenatal visits You will have prenatal exams every 2 weeks until week 36. Then you will have weekly prenatal exams. During a routine prenatal visit:  You will be weighed to make sure you and the baby are growing normally.  Your blood pressure will be taken.  Your abdomen will be measured to track your baby's growth.  The fetal heartbeat will be listened to.  Any test results from the previous visit will be discussed.  You may have a cervical check near your due date to see if your cervix has softened or thinned (effaced).  You will be tested for Group B streptococcus. This happens between 35 and 37 weeks.  Your health care provider may ask you:  What your birth plan is.  How you are feeling.  If you are feeling the baby move.  If you have had   any abnormal symptoms, such as leaking fluid, bleeding, severe headaches, or abdominal cramping.  If you are using any tobacco products, including cigarettes, chewing tobacco, and electronic cigarettes.  If you have any questions.  Other tests or screenings that may be performed during your third trimester include:  Blood tests that check for low iron levels (anemia).  Fetal testing to check the health, activity level, and growth of the fetus. Testing is done if you have certain medical conditions or if there are problems during the  pregnancy.  Nonstress test (NST). This test checks the health of your baby to make sure there are no signs of problems, such as the baby not getting enough oxygen. During this test, a belt is placed around your belly. The baby is made to move, and its heart rate is monitored during movement.  What is false labor? False labor is a condition in which you feel small, irregular tightenings of the muscles in the womb (contractions) that usually go away with rest, changing position, or drinking water. These are called Braxton Hicks contractions. Contractions may last for hours, days, or even weeks before true labor sets in. If contractions come at regular intervals, become more frequent, increase in intensity, or become painful, you should see your health care provider. What are the signs of labor?  Abdominal cramps.  Regular contractions that start at 10 minutes apart and become stronger and more frequent with time.  Contractions that start on the top of the uterus and spread down to the lower abdomen and back.  Increased pelvic pressure and dull back pain.  A watery or bloody mucus discharge that comes from the vagina.  Leaking of amniotic fluid. This is also known as your "water breaking." It could be a slow trickle or a gush. Let your health care provider know if it has a color or strange odor. If you have any of these signs, call your health care provider right away, even if it is before your due date. Follow these instructions at home: Medicines  Follow your health care provider's instructions regarding medicine use. Specific medicines may be either safe or unsafe to take during pregnancy.  Take a prenatal vitamin that contains at least 600 micrograms (mcg) of folic acid.  If you develop constipation, try taking a stool softener if your health care provider approves. Eating and drinking  Eat a balanced diet that includes fresh fruits and vegetables, whole grains, good sources of protein  such as meat, eggs, or tofu, and low-fat dairy. Your health care provider will help you determine the amount of weight gain that is right for you.  Avoid raw meat and uncooked cheese. These carry germs that can cause birth defects in the baby.  If you have low calcium intake from food, talk to your health care provider about whether you should take a daily calcium supplement.  Eat four or five small meals rather than three large meals a day.  Limit foods that are high in fat and processed sugars, such as fried and sweet foods.  To prevent constipation: ? Drink enough fluid to keep your urine clear or pale yellow. ? Eat foods that are high in fiber, such as fresh fruits and vegetables, whole grains, and beans. Activity  Exercise only as directed by your health care provider. Most women can continue their usual exercise routine during pregnancy. Try to exercise for 30 minutes at least 5 days a week. Stop exercising if you experience uterine contractions.  Avoid heavy   lifting.  Do not exercise in extreme heat or humidity, or at high altitudes.  Wear low-heel, comfortable shoes.  Practice good posture.  You may continue to have sex unless your health care provider tells you otherwise. Relieving pain and discomfort  Take frequent breaks and rest with your legs elevated if you have leg cramps or low back pain.  Take warm sitz baths to soothe any pain or discomfort caused by hemorrhoids. Use hemorrhoid cream if your health care provider approves.  Wear a good support bra to prevent discomfort from breast tenderness.  If you develop varicose veins: ? Wear support pantyhose or compression stockings as told by your healthcare provider. ? Elevate your feet for 15 minutes, 3-4 times a day. Prenatal care  Write down your questions. Take them to your prenatal visits.  Keep all your prenatal visits as told by your health care provider. This is important. Safety  Wear your seat belt at  all times when driving.  Make a list of emergency phone numbers, including numbers for family, friends, the hospital, and police and fire departments. General instructions  Avoid cat litter boxes and soil used by cats. These carry germs that can cause birth defects in the baby. If you have a cat, ask someone to clean the litter box for you.  Do not travel far distances unless it is absolutely necessary and only with the approval of your health care provider.  Do not use hot tubs, steam rooms, or saunas.  Do not drink alcohol.  Do not use any products that contain nicotine or tobacco, such as cigarettes and e-cigarettes. If you need help quitting, ask your health care provider.  Do not use any medicinal herbs or unprescribed drugs. These chemicals affect the formation and growth of the baby.  Do not douche or use tampons or scented sanitary pads.  Do not cross your legs for long periods of time.  To prepare for the arrival of your baby: ? Take prenatal classes to understand, practice, and ask questions about labor and delivery. ? Make a trial run to the hospital. ? Visit the hospital and tour the maternity area. ? Arrange for maternity or paternity leave through employers. ? Arrange for family and friends to take care of pets while you are in the hospital. ? Purchase a rear-facing car seat and make sure you know how to install it in your car. ? Pack your hospital bag. ? Prepare the baby's nursery. Make sure to remove all pillows and stuffed animals from the baby's crib to prevent suffocation.  Visit your dentist if you have not gone during your pregnancy. Use a soft toothbrush to brush your teeth and be gentle when you floss. Contact a health care provider if:  You are unsure if you are in labor or if your water has broken.  You become dizzy.  You have mild pelvic cramps, pelvic pressure, or nagging pain in your abdominal area.  You have lower back pain.  You have persistent  nausea, vomiting, or diarrhea.  You have an unusual or bad smelling vaginal discharge.  You have pain when you urinate. Get help right away if:  Your water breaks before 37 weeks.  You have regular contractions less than 5 minutes apart before 37 weeks.  You have a fever.  You are leaking fluid from your vagina.  You have spotting or bleeding from your vagina.  You have severe abdominal pain or cramping.  You have rapid weight loss or weight gain.    You have shortness of breath with chest pain.  You notice sudden or extreme swelling of your face, hands, ankles, feet, or legs.  Your baby makes fewer than 10 movements in 2 hours.  You have severe headaches that do not go away when you take medicine.  You have vision changes. Summary  The third trimester is from week 28 through week 40, months 7 through 9. The third trimester is a time when the unborn baby (fetus) is growing rapidly.  During the third trimester, your discomfort may increase as you and your baby continue to gain weight. You may have abdominal, leg, and back pain, sleeping problems, and an increased need to urinate.  During the third trimester your breasts will keep growing and they will continue to become tender. A yellow fluid (colostrum) may leak from your breasts. This is the first milk you are producing for your baby.  False labor is a condition in which you feel small, irregular tightenings of the muscles in the womb (contractions) that eventually go away. These are called Braxton Hicks contractions. Contractions may last for hours, days, or even weeks before true labor sets in.  Signs of labor can include: abdominal cramps; regular contractions that start at 10 minutes apart and become stronger and more frequent with time; watery or bloody mucus discharge that comes from the vagina; increased pelvic pressure and dull back pain; and leaking of amniotic fluid. This information is not intended to replace advice  given to you by your health care provider. Make sure you discuss any questions you have with your health care provider. Document Released: 04/19/2001 Document Revised: 10/01/2015 Document Reviewed: 06/26/2012 Elsevier Interactive Patient Education  2017 [employer]  

## 2016-11-16 NOTE — Progress Notes (Signed)
   PRENATAL VISIT NOTE  Subjective:  Cassandra Avery is a 31 y.o. G1P0 at 4871w3d being seen today for ongoing prenatal care.  She is currently monitored for the following issues for this low-risk pregnancy and has Supervision of normal pregnancy on her problem list.  Patient reports no complaints.  Contractions: Not present. Vag. Bleeding: None.  Movement: Present. Denies leaking of fluid.   The following portions of the patient's history were reviewed and updated as appropriate: allergies, current medications, past family history, past medical history, past social history, past surgical history and problem list. Problem list updated.  Objective:   Vitals:   11/16/16 1630 11/16/16 1634  BP: 138/75 126/82  Pulse: 98   Weight: 217 lb (98.4 kg)     Fetal Status: Fetal Heart Rate (bpm): 149   Movement: Present     General:  Alert, oriented and cooperative. Patient is in no acute distress.  Skin: Skin is warm and dry. No rash noted.   Cardiovascular: Normal heart rate noted  Respiratory: Normal respiratory effort, no problems with respiration noted  Abdomen: Soft, gravid, appropriate for gestational age. Pain/Pressure: Absent     Pelvic:  Cervical exam deferred        Extremities: Normal range of motion.  Edema: None  Mental Status: Normal mood and affect. Normal behavior. Normal judgment and thought content.   Assessment and Plan:  Pregnancy: G1P0 at 4971w3d  1. Encounter for supervision of normal first pregnancy in second trimester Doing well  Preterm labor symptoms and general obstetric precautions including but not limited to vaginal bleeding, contractions, leaking of fluid and fetal movement were reviewed in detail with the patient. Please refer to After Visit Summary for other counseling recommendations.  Return in about 2 weeks (around 11/30/2016).   Scheryl DarterJames Ember Gottwald, MD

## 2016-11-23 DIAGNOSIS — Z3493 Encounter for supervision of normal pregnancy, unspecified, third trimester: Secondary | ICD-10-CM

## 2016-12-01 ENCOUNTER — Ambulatory Visit (INDEPENDENT_AMBULATORY_CARE_PROVIDER_SITE_OTHER): Payer: Medicaid Other | Admitting: Obstetrics and Gynecology

## 2016-12-01 DIAGNOSIS — Z23 Encounter for immunization: Secondary | ICD-10-CM | POA: Diagnosis not present

## 2016-12-01 DIAGNOSIS — Z3400 Encounter for supervision of normal first pregnancy, unspecified trimester: Secondary | ICD-10-CM

## 2016-12-01 DIAGNOSIS — Z3403 Encounter for supervision of normal first pregnancy, third trimester: Secondary | ICD-10-CM

## 2016-12-01 NOTE — Progress Notes (Signed)
   PRENATAL VISIT NOTE  Subjective:  Cassandra Avery is a 31 y.o. G1P0 at 8042w4d being seen today for ongoing prenatal care.  She is currently monitored for the following issues for this low-risk pregnancy and has Supervision of normal pregnancy on her problem list.  Patient reports no complaints.  Contractions: Not present. Vag. Bleeding: None.  Movement: Present. Denies leaking of fluid.   The following portions of the patient's history were reviewed and updated as appropriate: allergies, current medications, past family history, past medical history, past social history, past surgical history and problem list. Problem list updated.  Objective:   Vitals:   12/01/16 1538  BP: 122/76  Pulse: (!) 101  Weight: 220 lb (99.8 kg)    Fetal Status: Fetal Heart Rate (bpm): 145 Fundal Height: 31 cm Movement: Present     General:  Alert, oriented and cooperative. Patient is in no acute distress.  Skin: Skin is warm and dry. No rash noted.   Cardiovascular: Normal heart rate noted  Respiratory: Normal respiratory effort, no problems with respiration noted  Abdomen: Soft, gravid, appropriate for gestational age.  Pain/Pressure: Absent     Pelvic: Cervical exam deferred        Extremities: Normal range of motion.  Edema: None  Mental Status:  Normal mood and affect. Normal behavior. Normal judgment and thought content.   Assessment and Plan:  Pregnancy: G1P0 at 142w4d  1. Supervision of normal first pregnancy, antepartum Patient is doing well without complaints Patient is considering OCP Still researching pediatricians  Preterm labor symptoms and general obstetric precautions including but not limited to vaginal bleeding, contractions, leaking of fluid and fetal movement were reviewed in detail with the patient. Please refer to After Visit Summary for other counseling recommendations.  Return in about 2 weeks (around 12/15/2016) for ROB.   Catalina AntiguaPeggy Miranda Frese, MD

## 2016-12-05 ENCOUNTER — Telehealth: Payer: Self-pay

## 2016-12-05 NOTE — Telephone Encounter (Signed)
Contacted pt to let her know FMLA paperwork is ready to be picked up at the front, left vm.

## 2016-12-15 ENCOUNTER — Ambulatory Visit (INDEPENDENT_AMBULATORY_CARE_PROVIDER_SITE_OTHER): Payer: Medicaid Other | Admitting: Certified Nurse Midwife

## 2016-12-15 ENCOUNTER — Encounter: Payer: Self-pay | Admitting: Certified Nurse Midwife

## 2016-12-15 VITALS — BP 120/78 | HR 97 | Wt 220.7 lb

## 2016-12-15 DIAGNOSIS — O26843 Uterine size-date discrepancy, third trimester: Secondary | ICD-10-CM

## 2016-12-15 DIAGNOSIS — Z3403 Encounter for supervision of normal first pregnancy, third trimester: Secondary | ICD-10-CM

## 2016-12-15 NOTE — Progress Notes (Signed)
   PRENATAL VISIT NOTE  Subjective:  Cassandra Avery is a 31 y.o. G1P0 at 641w4d being seen today for ongoing prenatal care.  She is currently monitored for the following issues for this low-risk pregnancy and has Supervision of normal pregnancy on her problem list.  Patient reports no complaints.  Contractions: Not present. Vag. Bleeding: None.  Movement: Present. Denies leaking of fluid.   The following portions of the patient's history were reviewed and updated as appropriate: allergies, current medications, past family history, past medical history, past social history, past surgical history and problem list. Problem list updated.  Objective:   Vitals:   12/15/16 1540  BP: 120/78  Pulse: 97  Weight: 220 lb 11.2 oz (100.1 kg)    Fetal Status: Fetal Heart Rate (bpm): 135 Fundal Height: 36 cm Movement: Present     General:  Alert, oriented and cooperative. Patient is in no acute distress.  Skin: Skin is warm and dry. No rash noted.   Cardiovascular: Normal heart rate noted  Respiratory: Normal respiratory effort, no problems with respiration noted  Abdomen: Soft, gravid, appropriate for gestational age.  Pain/Pressure: Absent     Pelvic: Cervical exam deferred        Extremities: Normal range of motion.  Edema: Trace  Mental Status:  Normal mood and affect. Normal behavior. Normal judgment and thought content.   Assessment and Plan:  Pregnancy: G1P0 at 201w4d  1. Encounter for supervision of normal first pregnancy in third trimester     Doing well  2. Uterine size date discrepancy pregnancy, third trimester     Size > Dates - US MFM OB FOLLOW UP; Future  Preterm labor symptoms and general obstetric precautions including but not limited to vaginal bleeding, contractions, leaking of fluid and fetal movement were reviewed in detail with the patient. Please refer to After Visit Summary for other counseling recommendations.  Return in about 2 weeks (around 12/29/2016) for ROB,  GBS.   Roe Coombsachelle A Caysie Minnifield, CNM

## 2016-12-15 NOTE — Patient Instructions (Addendum)
Contraception Choices Contraception (birth control) is the use of any methods or devices to prevent pregnancy. Below are some methods to help avoid pregnancy. Hormonal methods  Contraceptive implant. This is a thin, plastic tube containing progesterone hormone. It does not contain estrogen hormone. Your health care provider inserts the tube in the inner part of the upper arm. The tube can remain in place for up to 3 years. After 3 years, the implant must be removed. The implant prevents the ovaries from releasing an egg (ovulation), thickens the cervical mucus to prevent sperm from entering the uterus, and thins the lining of the inside of the uterus.  Progesterone-only injections. These injections are given every 3 months by your health care provider to prevent pregnancy. This synthetic progesterone hormone stops the ovaries from releasing eggs. It also thickens cervical mucus and changes the uterine lining. This makes it harder for sperm to survive in the uterus.  Birth control pills. These pills contain estrogen and progesterone hormone. They work by preventing the ovaries from releasing eggs (ovulation). They also cause the cervical mucus to thicken, preventing the sperm from entering the uterus. Birth control pills are prescribed by a health care provider.Birth control pills can also be used to treat heavy periods.  Minipill. This type of birth control pill contains only the progesterone hormone. They are taken every day of each month and must be prescribed by your health care provider.  Birth control patch. The patch contains hormones similar to those in birth control pills. It must be changed once a week and is prescribed by a health care provider.  Vaginal ring. The ring contains hormones similar to those in birth control pills. It is left in the vagina for 3 weeks, removed for 1 week, and then a new one is put back in place. The patient must be comfortable inserting and removing the ring from  the vagina.A health care provider's prescription is necessary.  Emergency contraception. Emergency contraceptives prevent pregnancy after unprotected sexual intercourse. This pill can be taken right after sex or up to 5 days after unprotected sex. It is most effective the sooner you take the pills after having sexual intercourse. Most emergency contraceptive pills are available without a prescription. Check with your pharmacist. Do not use emergency contraception as your only form of birth control. Barrier methods  Female condom. This is a thin sheath (latex or rubber) that is worn over the penis during sexual intercourse. It can be used with spermicide to increase effectiveness.  Female condom. This is a soft, loose-fitting sheath that is put into the vagina before sexual intercourse.  Diaphragm. This is a soft, latex, dome-shaped barrier that must be fitted by a health care provider. It is inserted into the vagina, along with a spermicidal jelly. It is inserted before intercourse. The diaphragm should be left in the vagina for 6 to 8 hours after intercourse.  Cervical cap. This is a round, soft, latex or plastic cup that fits over the cervix and must be fitted by a health care provider. The cap can be left in place for up to 48 hours after intercourse.  Sponge. This is a soft, circular piece of polyurethane foam. The sponge has spermicide in it. It is inserted into the vagina after wetting it and before sexual intercourse.  Spermicides. These are chemicals that kill or block sperm from entering the cervix and uterus. They come in the form of creams, jellies, suppositories, foam, or tablets. They do not require a prescription. They   are inserted into the vagina with an applicator before having sexual intercourse. The process must be repeated every time you have sexual intercourse. Intrauterine contraception  Intrauterine device (IUD). This is a T-shaped device that is put in a woman's uterus during  a menstrual period to prevent pregnancy. There are 2 types: ? Copper IUD. This type of IUD is wrapped in copper wire and is placed inside the uterus. Copper makes the uterus and fallopian tubes produce a fluid that kills sperm. It can stay in place for 10 years. ? Hormone IUD. This type of IUD contains the hormone progestin (synthetic progesterone). The hormone thickens the cervical mucus and prevents sperm from entering the uterus, and it also thins the uterine lining to prevent implantation of a fertilized egg. The hormone can weaken or kill the sperm that get into the uterus. It can stay in place for 3-5 years, depending on which type of IUD is used. Permanent methods of contraception  Female tubal ligation. This is when the woman's fallopian tubes are surgically sealed, tied, or blocked to prevent the egg from traveling to the uterus.  Hysteroscopic sterilization. This involves placing a small coil or insert into each fallopian tube. Your doctor uses a technique called hysteroscopy to do the procedure. The device causes scar tissue to form. This results in permanent blockage of the fallopian tubes, so the sperm cannot fertilize the egg. It takes about 3 months after the procedure for the tubes to become blocked. You must use another form of birth control for these 3 months.  Female sterilization. This is when the female has the tubes that carry sperm tied off (vasectomy).This blocks sperm from entering the vagina during sexual intercourse. After the procedure, the man can still ejaculate fluid (semen). Natural planning methods  Natural family planning. This is not having sexual intercourse or using a barrier method (condom, diaphragm, cervical cap) on days the woman could become pregnant.  Calendar method. This is keeping track of the length of each menstrual cycle and identifying when you are fertile.  Ovulation method. This is avoiding sexual intercourse during ovulation.  Symptothermal method.  This is avoiding sexual intercourse during ovulation, using a thermometer and ovulation symptoms.  Post-ovulation method. This is timing sexual intercourse after you have ovulated. Regardless of which type or method of contraception you choose, it is important that you use condoms to protect against the transmission of sexually transmitted infections (STIs). Talk with your health care provider about which form of contraception is most appropriate for you. This information is not intended to replace advice given to you by your health care provider. Make sure you discuss any questions you have with your health care provider. Document Released: 04/25/2005 Document Revised: 10/01/2015 Document Reviewed: 10/18/2012 Elsevier Interactive Patient Education  2017 [employer]  Before Baby Comes Home Before your baby arrives it is important to:  Have all of the supplies that you will need to care for your baby.  Know where to go if there is an emergency.  Discuss the baby's arrival with other family members.  What supplies will I need?  It is recommended that you have the following supplies: Large Items  Crib.  Crib mattress.  Rear-facing infant car seat. If possible, have a trained professional check to make sure that it is installed correctly.  Feeding  6-8 bottles that are 4-5 oz in size.  6-8 nipples.  Bottle brush.  Sterilizer, or a large pan or kettle with a lid.  A way  to boil and cool water.  If you will be breastfeeding: ? Breast pump. ? Nipple cream. ? Nursing bra. ? Breast pads. ? Breast shields.  If you will be formula feeding: ? Formula. ? Measuring cups. ? Measuring spoons.  Bathing  Mild baby soap and baby shampoo.  Petroleum jelly.  Soft cloth towel and washcloth.  Hooded towel.  Cotton balls.  Bath basin.  Other Supplies  Rectal thermometer.  Bulb syringe.  Baby wipes or washcloths for diaper changes.  Diaper bag.  Changing  pad.  Clothing, including one-piece outfits and pajamas.  Baby nail clippers.  Receiving blankets.  Mattress pad and sheets for the crib.  Night-light for the baby's room.  Baby monitor.  2 or 3 pacifiers.  Either 24-36 cloth diapers and waterproof diaper covers or a box of disposable diapers. You may need to use as many as 10-12 diapers per day.  How do I prepare for an emergency? Prepare for an emergency by:  Knowing how to get to the nearest hospital.  Listing the phone numbers of your baby's health care providers near your home phone and in your cell phone.  How do I prepare my family?  Decide how to handle visitors.  If you have other children: ? Talk with them about the baby coming home. Ask them how they feel about it. ? Read a book together about being a new big brother or sister. ? Find ways to let them help you prepare for the new baby. ? Have someone ready to care for them while you are in the hospital. This information is not intended to replace advice given to you by your health care provider. Make sure you discuss any questions you have with your health care provider. Document Released: 04/07/2008 Document Revised: 10/01/2015 Document Reviewed: 04/02/2014 Elsevier Interactive Patient Education  [employer].

## 2016-12-17 ENCOUNTER — Encounter (HOSPITAL_COMMUNITY): Payer: Self-pay

## 2016-12-17 ENCOUNTER — Inpatient Hospital Stay (HOSPITAL_COMMUNITY)
Admission: AD | Admit: 2016-12-17 | Discharge: 2016-12-17 | Disposition: A | Discharge status: Home / Self Care | Payer: Medicaid Other | Source: Ambulatory Visit | Attending: Family Medicine | Admitting: Family Medicine

## 2016-12-17 DIAGNOSIS — W010XXA Fall on same level from slipping, tripping and stumbling without subsequent striking against object, initial encounter: Secondary | ICD-10-CM

## 2016-12-17 DIAGNOSIS — O26893 Other specified pregnancy related conditions, third trimester: Secondary | ICD-10-CM | POA: Insufficient documentation

## 2016-12-17 DIAGNOSIS — Z8489 Family history of other specified conditions: Secondary | ICD-10-CM | POA: Diagnosis not present

## 2016-12-17 DIAGNOSIS — O9989 Other specified diseases and conditions complicating pregnancy, childbirth and the puerperium: Secondary | ICD-10-CM | POA: Diagnosis not present

## 2016-12-17 DIAGNOSIS — R109 Unspecified abdominal pain: Secondary | ICD-10-CM | POA: Diagnosis not present

## 2016-12-17 DIAGNOSIS — Z3A33 33 weeks gestation of pregnancy: Secondary | ICD-10-CM | POA: Diagnosis not present

## 2016-12-17 DIAGNOSIS — Z825 Family history of asthma and other chronic lower respiratory diseases: Secondary | ICD-10-CM | POA: Insufficient documentation

## 2016-12-17 DIAGNOSIS — Z823 Family history of stroke: Secondary | ICD-10-CM | POA: Diagnosis not present

## 2016-12-17 LAB — URINALYSIS, ROUTINE W REFLEX MICROSCOPIC
Bilirubin Urine: NEGATIVE
Glucose, UA: NEGATIVE mg/dL
HGB URINE DIPSTICK: NEGATIVE
Ketones, ur: 20 mg/dL — AB
Nitrite: NEGATIVE
Protein, ur: 30 mg/dL — AB
SPECIFIC GRAVITY, URINE: 1.011 (ref 1.005–1.030)
pH: 6 (ref 5.0–8.0)

## 2016-12-17 NOTE — MAU Provider Note (Signed)
History   G1 @ 33.6 wks was at work slipped and fell hitting left side of abd. C/o pain lower left quad which is now easing up after being placed to bed. Denies vag bleeding or ROM.  CSN: [id number]  Arrival date & time 12/17/16  1121   None     Chief Complaint  Patient presents with  . Fall  . Abdominal Pain    HPI  Past Medical History:  Diagnosis Date  . Headache   . Recurrent upper respiratory infection (URI)     Past Surgical History:  Procedure Laterality Date  . NO PAST SURGERIES      Family History  Problem Relation Age of Onset  . Miscarriages / [place] Mother   . Asthma Sister   . Stroke Paternal Grandmother   . Hearing loss Paternal Grandfather     Social History  Substance Use Topics  . Smoking status: Never Smoker  . Smokeless tobacco: Never Used  . Alcohol use Yes     Comment: occasional    OB History    Gravida Para Term Preterm AB Living   1             SAB TAB Ectopic Multiple Live Births                  Review of Systems  Constitutional: Negative.   HENT: Negative.   Eyes: Negative.   Respiratory: Negative.   Cardiovascular: Negative.   Gastrointestinal: Positive for abdominal pain.  Endocrine: Negative.   Genitourinary: Negative.   Musculoskeletal: Negative.   Skin: Negative.   Allergic/Immunologic: Negative.   Neurological: Negative.   Hematological: Negative.   Psychiatric/Behavioral: Negative.     Allergies  Patient has no known allergies.  Home Medications    BP 129/75   Pulse (!) 118   Temp 98.6 F (37 C) (Oral)   Resp 20   Wt 218 lb 12.8 oz (99.2 kg)   LMP 04/24/2016 (Exact Date)   SpO2 99%   BMI 40.02 kg/m   Physical Exam  Constitutional: She is oriented to person, place, and time. She appears well-developed and well-nourished.  HENT:  Head: Normocephalic.  Neck: Normal range of motion.  Cardiovascular: Normal rate, regular rhythm, normal heart sounds and intact distal pulses.   Pulmonary/Chest:  Effort normal and breath sounds normal.  Abdominal: Soft. Bowel sounds are normal.  Musculoskeletal: Normal range of motion.  Neurological: She is alert and oriented to person, place, and time. She has normal reflexes.  Skin: Skin is warm and dry.  Psychiatric: She has a normal mood and affect. Her behavior is normal. Judgment and thought content normal.    MAU Course  Procedures (including critical care time)  Labs Reviewed  URINALYSIS, ROUTINE W REFLEX MICROSCOPIC - Abnormal; Notable for the following:       Result Value   APPearance CLOUDY (*)    Ketones, ur 20 (*)    Protein, ur 30 (*)    Leukocytes, UA LARGE (*)    Bacteria, UA FEW (*)    Squamous Epithelial / LPF 6-30 (*)    All other components within normal limits   No results found.   1. Fall on same level from slipping, initial encounter       MDM  VSS, exam WNL. FHR pattern reassuring with accels no decels. No uc's. abd pain has improved. Will d/c home agter 4 hrs of EFM

## 2016-12-17 NOTE — Discharge Instructions (Signed)

## 2016-12-17 NOTE — MAU Note (Signed)
Slipped and fell at work, is having belly pain and cramping. Landed on left side of abd.

## 2016-12-26 ENCOUNTER — Ambulatory Visit (HOSPITAL_COMMUNITY)
Admission: RE | Admit: 2016-12-26 | Discharge: 2016-12-26 | Disposition: A | Discharge status: Home / Self Care | Payer: Medicaid Other | Source: Ambulatory Visit | Attending: Certified Nurse Midwife | Admitting: Certified Nurse Midwife

## 2016-12-26 DIAGNOSIS — Z362 Encounter for other antenatal screening follow-up: Secondary | ICD-10-CM | POA: Insufficient documentation

## 2016-12-26 DIAGNOSIS — E669 Obesity, unspecified: Secondary | ICD-10-CM | POA: Diagnosis not present

## 2016-12-26 DIAGNOSIS — O26843 Uterine size-date discrepancy, third trimester: Secondary | ICD-10-CM | POA: Insufficient documentation

## 2016-12-26 DIAGNOSIS — Z3A35 35 weeks gestation of pregnancy: Secondary | ICD-10-CM | POA: Diagnosis not present

## 2016-12-26 DIAGNOSIS — O99213 Obesity complicating pregnancy, third trimester: Secondary | ICD-10-CM | POA: Insufficient documentation

## 2016-12-27 ENCOUNTER — Other Ambulatory Visit: Payer: Self-pay | Admitting: Certified Nurse Midwife

## 2016-12-29 ENCOUNTER — Other Ambulatory Visit (HOSPITAL_COMMUNITY)
Admission: RE | Admit: 2016-12-29 | Discharge: 2016-12-29 | Disposition: A | Discharge status: Home / Self Care | Payer: Medicaid Other | Source: Ambulatory Visit | Attending: Certified Nurse Midwife | Admitting: Certified Nurse Midwife

## 2016-12-29 ENCOUNTER — Encounter: Payer: Medicaid Other | Admitting: Certified Nurse Midwife

## 2016-12-29 ENCOUNTER — Ambulatory Visit (INDEPENDENT_AMBULATORY_CARE_PROVIDER_SITE_OTHER): Payer: Medicaid Other | Admitting: Certified Nurse Midwife

## 2016-12-29 VITALS — BP 118/74 | HR 96 | Wt 223.2 lb

## 2016-12-29 DIAGNOSIS — O98813 Other maternal infectious and parasitic diseases complicating pregnancy, third trimester: Secondary | ICD-10-CM | POA: Insufficient documentation

## 2016-12-29 DIAGNOSIS — B373 Candidiasis of vulva and vagina: Secondary | ICD-10-CM | POA: Diagnosis not present

## 2016-12-29 DIAGNOSIS — Z3403 Encounter for supervision of normal first pregnancy, third trimester: Secondary | ICD-10-CM

## 2016-12-29 NOTE — Progress Notes (Signed)
   PRENATAL VISIT NOTE  Subjective:  Cassandra Avery is a 31 y.o. G1P0 at [redacted]w[redacted]d being seen today for ongoing prenatal care.  She is currently monitored for the following issues for this low-risk pregnancy and has Supervision of normal pregnancy on her problem list.  Patient reports no complaints.  Contractions: Irregular. Vag. Bleeding: None.  Movement: Present. Denies leaking of fluid.   The following portions of the patient's history were reviewed and updated as appropriate: allergies, current medications, past family history, past medical history, past social history, past surgical history and problem list. Problem list updated.  Objective:   Vitals:   12/29/16 1437  BP: 118/74  Pulse: 96  Weight: 223 lb 3.2 oz (101.2 kg)    Fetal Status: Fetal Heart Rate (bpm): 136 Fundal Height: 38 cm Movement: Present  Presentation: Vertex  General:  Alert, oriented and cooperative. Patient is in no acute distress.  Skin: Skin is warm and dry. No rash noted.   Cardiovascular: Normal heart rate noted  Respiratory: Normal respiratory effort, no problems with respiration noted  Abdomen: Soft, gravid, appropriate for gestational age.  Pain/Pressure: Present     Pelvic: Cervical exam performed Dilation: Closed Effacement (%): Thick Station: Ballotable  Extremities: Normal range of motion.  Edema: None  Mental Status:  Normal mood and affect. Normal behavior. Normal judgment and thought content.   Assessment and Plan:  Pregnancy: G1P0 at [redacted]w[redacted]d  1. Encounter for supervision of normal first pregnancy in third trimester     Doing well.  - Strep Gp B NAA - Cervicovaginal ancillary only  Preterm labor symptoms and general obstetric precautions including but not limited to vaginal bleeding, contractions, leaking of fluid and fetal movement were reviewed in detail with the patient. Please refer to After Visit Summary for other counseling recommendations.  Return in about 1 week (around 01/05/2017) for  ROB.   Roe Coombs, CNM

## 2016-12-29 NOTE — Progress Notes (Signed)
Patient states she has some itching going on.

## 2016-12-30 LAB — CERVICOVAGINAL ANCILLARY ONLY
Bacterial vaginitis: NEGATIVE
CANDIDA VAGINITIS: POSITIVE — AB
CHLAMYDIA, DNA PROBE: NEGATIVE
Neisseria Gonorrhea: NEGATIVE
Trichomonas: NEGATIVE

## 2016-12-31 LAB — STREP GP B NAA: STREP GROUP B AG: NEGATIVE

## 2017-01-03 ENCOUNTER — Telehealth: Payer: Self-pay

## 2017-01-03 ENCOUNTER — Other Ambulatory Visit: Payer: Self-pay | Admitting: Certified Nurse Midwife

## 2017-01-03 DIAGNOSIS — Z3403 Encounter for supervision of normal first pregnancy, third trimester: Secondary | ICD-10-CM

## 2017-01-03 DIAGNOSIS — B373 Candidiasis of vulva and vagina: Secondary | ICD-10-CM

## 2017-01-03 DIAGNOSIS — B3731 Acute candidiasis of vulva and vagina: Secondary | ICD-10-CM

## 2017-01-03 MED ORDER — TERCONAZOLE 0.8 % VA CREA: 1.0000 | TOPICAL_CREAM | Freq: Every day | VAGINAL | 0 refills | Status: DC

## 2017-01-03 MED ORDER — FLUCONAZOLE 150 MG PO TABS: 150.0000 mg | ORAL_TABLET | Freq: Once | ORAL | 0 refills | Status: AC

## 2017-01-03 NOTE — Telephone Encounter (Signed)
Attempted to contact about results, left vm. 

## 2017-01-05 ENCOUNTER — Ambulatory Visit (INDEPENDENT_AMBULATORY_CARE_PROVIDER_SITE_OTHER): Payer: Medicaid Other | Admitting: Certified Nurse Midwife

## 2017-01-05 ENCOUNTER — Encounter: Payer: Self-pay | Admitting: Certified Nurse Midwife

## 2017-01-05 DIAGNOSIS — Z3403 Encounter for supervision of normal first pregnancy, third trimester: Secondary | ICD-10-CM

## 2017-01-05 NOTE — Progress Notes (Signed)
   PRENATAL VISIT NOTE  Subjective:  Cassandra Avery is a 31 y.o. G1P0 at 1961w4d being seen today for ongoing prenatal care.  She is currently monitored for the following issues for this low-risk pregnancy and has Supervision of normal pregnancy on her problem list.  Patient reports carpal tunnel symptoms, no bleeding, no contractions, no cramping, no leaking and has tried wrist splints, not helping.  Contractions: Not present. Vag. Bleeding: None.  Movement: Present. Denies leaking of fluid.   The following portions of the patient's history were reviewed and updated as appropriate: allergies, current medications, past family history, past medical history, past social history, past surgical history and problem list. Problem list updated.  Objective:   Vitals:   01/05/17 1540  BP: 128/79  Pulse: 92  Weight: 224 lb (101.6 kg)    Fetal Status: Fetal Heart Rate (bpm): 145; doppler Fundal Height: 36 cm Movement: Present     General:  Alert, oriented and cooperative. Patient is in no acute distress.  Skin: Skin is warm and dry. No rash noted.   Cardiovascular: Normal heart rate noted  Respiratory: Normal respiratory effort, no problems with respiration noted  Abdomen: Soft, gravid, appropriate for gestational age.  Pain/Pressure: Present     Pelvic: Cervical exam deferred        Extremities: Normal range of motion.     Mental Status:  Normal mood and affect. Normal behavior. Normal judgment and thought content.   Assessment and Plan:  Pregnancy: G1P0 at 6461w4d  1. Encounter for supervision of normal first pregnancy in third trimester     Carpel tunnel syndrome, otherwise doing well.   Preterm labor symptoms and general obstetric precautions including but not limited to vaginal bleeding, contractions, leaking of fluid and fetal movement were reviewed in detail with the patient. Please refer to After Visit Summary for other counseling recommendations.  Return in about 1 week (around 01/12/2017)  for ROB.   Roe Coombsachelle A Elder Davidian, CNM

## 2017-01-12 ENCOUNTER — Ambulatory Visit (INDEPENDENT_AMBULATORY_CARE_PROVIDER_SITE_OTHER): Payer: Medicaid Other | Admitting: Certified Nurse Midwife

## 2017-01-12 ENCOUNTER — Encounter: Payer: Self-pay | Admitting: Certified Nurse Midwife

## 2017-01-12 VITALS — BP 125/80 | HR 85 | Wt 228.4 lb

## 2017-01-12 DIAGNOSIS — Z3403 Encounter for supervision of normal first pregnancy, third trimester: Secondary | ICD-10-CM

## 2017-01-12 NOTE — Progress Notes (Signed)
Patient reports good fetal movement and complains of pressure.

## 2017-01-12 NOTE — Progress Notes (Signed)
   PRENATAL VISIT NOTE  Subjective:  Cassandra Avery is a 31 y.o. G1P0 at 1060w4d being seen today for ongoing prenatal care.  She is currently monitored for the following issues for this low-risk pregnancy and has Supervision of normal pregnancy on her problem list.  Patient reports no complaints.  Contractions: Not present. Vag. Bleeding: None.  Movement: Present. Denies leaking of fluid.   The following portions of the patient's history were reviewed and updated as appropriate: allergies, current medications, past family history, past medical history, past social history, past surgical history and problem list. Problem list updated.  Objective:   Vitals:   01/12/17 1610  BP: 125/80  Pulse: 85  Weight: 228 lb 6.4 oz (103.6 kg)    Fetal Status: Fetal Heart Rate (bpm): 135; doppler Fundal Height: 37 cm Movement: Present     General:  Alert, oriented and cooperative. Patient is in no acute distress.  Skin: Skin is warm and dry. No rash noted.   Cardiovascular: Normal heart rate noted  Respiratory: Normal respiratory effort, no problems with respiration noted  Abdomen: Soft, gravid, appropriate for gestational age.  Pain/Pressure: Present     Pelvic: Cervical exam deferred        Extremities: Normal range of motion.  Edema: Trace  Mental Status:  Normal mood and affect. Normal behavior. Normal judgment and thought content.   Assessment and Plan:  Pregnancy: G1P0 at 2860w4d  1. Encounter for supervision of normal first pregnancy in third trimester     Doing well.   Term labor symptoms and general obstetric precautions including but not limited to vaginal bleeding, contractions, leaking of fluid and fetal movement were reviewed in detail with the patient. Please refer to After Visit Summary for other counseling recommendations.  Return in about 1 week (around 01/19/2017) for ROB.   Cassandra Avery, CNM

## 2017-01-19 ENCOUNTER — Encounter: Payer: Self-pay | Admitting: Certified Nurse Midwife

## 2017-01-19 ENCOUNTER — Ambulatory Visit (INDEPENDENT_AMBULATORY_CARE_PROVIDER_SITE_OTHER): Payer: Medicaid Other | Admitting: Certified Nurse Midwife

## 2017-01-19 VITALS — BP 135/87 | HR 99 | Wt 226.9 lb

## 2017-01-19 DIAGNOSIS — Z3403 Encounter for supervision of normal first pregnancy, third trimester: Secondary | ICD-10-CM

## 2017-01-19 NOTE — Progress Notes (Signed)
   PRENATAL VISIT NOTE  Subjective:  Cassandra Avery is a 31 y.o. G1P0 at 4957w4d being seen today for ongoing prenatal care.  She is currently monitored for the following issues for this low-risk pregnancy and has Supervision of normal pregnancy on her problem list.  Patient reports no complaints.  Contractions: Irregular. Vag. Bleeding: None.  Movement: Present. Denies leaking of fluid.   The following portions of the patient's history were reviewed and updated as appropriate: allergies, current medications, past family history, past medical history, past social history, past surgical history and problem list. Problem list updated.  Objective:   Vitals:   01/19/17 1534  BP: 135/87  Pulse: 99  Weight: 226 lb 14.4 oz (102.9 kg)    Fetal Status: Fetal Heart Rate (bpm): 155; doppler Fundal Height: 44 cm Movement: Present  Presentation: Vertex  General:  Alert, oriented and cooperative. Patient is in no acute distress.  Skin: Skin is warm and dry. No rash noted.   Cardiovascular: Normal heart rate noted  Respiratory: Normal respiratory effort, no problems with respiration noted  Abdomen: Soft, gravid, appropriate for gestational age.  Pain/Pressure: Present     Pelvic: Cervical exam performed Dilation: Closed Effacement (%): Thick Station: -3, outer os 0.5 cm  Extremities: Normal range of motion.  Edema: Trace  Mental Status:  Normal mood and affect. Normal behavior. Normal judgment and thought content.   Assessment and Plan:  Pregnancy: G1P0 at 3257w4d  1. Encounter for supervision of normal first pregnancy in third trimester     Doing well.   Term labor symptoms and general obstetric precautions including but not limited to vaginal bleeding, contractions, leaking of fluid and fetal movement were reviewed in detail with the patient. Please refer to After Visit Summary for other counseling recommendations.  Return in about 1 week (around 01/26/2017) for ROB, IOL.   Roe Coombsachelle A Denney,  CNM

## 2017-01-26 ENCOUNTER — Ambulatory Visit (INDEPENDENT_AMBULATORY_CARE_PROVIDER_SITE_OTHER): Payer: Medicaid Other | Admitting: Certified Nurse Midwife

## 2017-01-26 ENCOUNTER — Inpatient Hospital Stay (HOSPITAL_COMMUNITY)
Admission: AD | Admit: 2017-01-26 | Discharge: 2017-01-28 | DRG: 775 | Disposition: A | Discharge status: Home / Self Care | Payer: Medicaid Other | Source: Ambulatory Visit | Attending: Obstetrics & Gynecology | Admitting: Obstetrics & Gynecology

## 2017-01-26 ENCOUNTER — Encounter: Payer: Self-pay | Admitting: Certified Nurse Midwife

## 2017-01-26 ENCOUNTER — Encounter (HOSPITAL_COMMUNITY): Payer: Self-pay | Admitting: *Deleted

## 2017-01-26 VITALS — BP 146/92 | HR 86 | Wt 226.0 lb

## 2017-01-26 DIAGNOSIS — Z6841 Body Mass Index (BMI) 40.0 and over, adult: Secondary | ICD-10-CM | POA: Diagnosis not present

## 2017-01-26 DIAGNOSIS — O99214 Obesity complicating childbirth: Secondary | ICD-10-CM | POA: Diagnosis present

## 2017-01-26 DIAGNOSIS — Z3403 Encounter for supervision of normal first pregnancy, third trimester: Secondary | ICD-10-CM

## 2017-01-26 DIAGNOSIS — O133 Gestational [pregnancy-induced] hypertension without significant proteinuria, third trimester: Secondary | ICD-10-CM

## 2017-01-26 DIAGNOSIS — O134 Gestational [pregnancy-induced] hypertension without significant proteinuria, complicating childbirth: Secondary | ICD-10-CM | POA: Diagnosis present

## 2017-01-26 DIAGNOSIS — O139 Gestational [pregnancy-induced] hypertension without significant proteinuria, unspecified trimester: Secondary | ICD-10-CM | POA: Diagnosis present

## 2017-01-26 DIAGNOSIS — Z3A39 39 weeks gestation of pregnancy: Secondary | ICD-10-CM | POA: Diagnosis not present

## 2017-01-26 HISTORY — DX: Gestational (pregnancy-induced) hypertension without significant proteinuria, unspecified trimester: O13.9

## 2017-01-26 LAB — COMPREHENSIVE METABOLIC PANEL
ALK PHOS: 190 U/L — AB (ref 38–126)
ALT: 13 U/L — AB (ref 14–54)
AST: 19 U/L (ref 15–41)
Albumin: 2.9 g/dL — ABNORMAL LOW (ref 3.5–5.0)
Anion gap: 8 (ref 5–15)
BUN: 6 mg/dL (ref 6–20)
CALCIUM: 9.4 mg/dL (ref 8.9–10.3)
CO2: 22 mmol/L (ref 22–32)
CREATININE: 0.59 mg/dL (ref 0.44–1.00)
Chloride: 105 mmol/L (ref 101–111)
GFR calc non Af Amer: >60 mL/min (ref >60)
GLUCOSE: 72 mg/dL (ref 65–99)
Potassium: 3.7 mmol/L (ref 3.5–5.1)
SODIUM: 135 mmol/L (ref 135–145)
Total Bilirubin: 0.6 mg/dL (ref 0.3–1.2)
Total Protein: 6.1 g/dL — ABNORMAL LOW (ref 6.5–8.1)

## 2017-01-26 LAB — CBC
HCT: 29.5 % — ABNORMAL LOW (ref 36.0–46.0)
HEMOGLOBIN: 9.5 g/dL — AB (ref 12.0–15.0)
MCH: 27.3 pg (ref 26.0–34.0)
MCHC: 32.2 g/dL (ref 30.0–36.0)
MCV: 84.8 fL (ref 78.0–100.0)
Platelets: 228 10*3/uL (ref 150–400)
RBC: 3.48 MIL/uL — AB (ref 3.87–5.11)
RDW: 15 % (ref 11.5–15.5)
WBC: 10.1 10*3/uL (ref 4.0–10.5)

## 2017-01-26 LAB — TYPE AND SCREEN
ABO/RH(D): O POS
ANTIBODY SCREEN: NEGATIVE

## 2017-01-26 LAB — PROTEIN / CREATININE RATIO, URINE
Creatinine, Urine: 284 mg/dL
Protein Creatinine Ratio: 0.18 mg/mg{Cre} — ABNORMAL HIGH (ref 0.00–0.15)
Total Protein, Urine: 52 mg/dL

## 2017-01-26 MED ORDER — MISOPROSTOL 50MCG HALF TABLET
50.0000 ug | ORAL_TABLET | ORAL | Status: DC
  Administered 2017-01-26: 50 ug via ORAL
  Filled 2017-01-26: qty 1

## 2017-01-26 MED ORDER — ACETAMINOPHEN 325 MG PO TABS: 650.0000 mg | ORAL_TABLET | ORAL | Status: DC | PRN

## 2017-01-26 MED ORDER — MISOPROSTOL 200 MCG PO TABS
ORAL_TABLET | ORAL | Status: AC
  Filled 2017-01-26: qty 1

## 2017-01-26 MED ORDER — SOD CITRATE-CITRIC ACID 500-334 MG/5ML PO SOLN: 30.0000 mL | ORAL | Status: DC | PRN

## 2017-01-26 MED ORDER — OXYTOCIN 40 UNITS IN LACTATED RINGERS INFUSION - SIMPLE MED
2.5000 [IU]/h | INTRAVENOUS | Status: DC
  Filled 2017-01-26: qty 1000

## 2017-01-26 MED ORDER — MISOPROSTOL 200 MCG PO TABS: 50.0000 ug | ORAL_TABLET | ORAL | Status: DC

## 2017-01-26 MED ORDER — LIDOCAINE HCL (PF) 1 % IJ SOLN
30.0000 mL | INTRAMUSCULAR | Status: DC | PRN
  Filled 2017-01-26: qty 30

## 2017-01-26 MED ORDER — OXYCODONE-ACETAMINOPHEN 5-325 MG PO TABS
1.0000 | ORAL_TABLET | ORAL | Status: DC | PRN
  Administered 2017-01-27: 1 via ORAL
  Filled 2017-01-26: qty 1

## 2017-01-26 MED ORDER — TERBUTALINE SULFATE 1 MG/ML IJ SOLN
0.2500 mg | Freq: Once | INTRAMUSCULAR | Status: DC | PRN
  Filled 2017-01-26: qty 1

## 2017-01-26 MED ORDER — OXYCODONE-ACETAMINOPHEN 5-325 MG PO TABS: 2.0000 | ORAL_TABLET | ORAL | Status: DC | PRN

## 2017-01-26 MED ORDER — LACTATED RINGERS IV SOLN
INTRAVENOUS | Status: DC
  Administered 2017-01-27 (×2): via INTRAVENOUS

## 2017-01-26 MED ORDER — OXYTOCIN 10 UNIT/ML IJ SOLN
10.0000 [IU] | Freq: Once | INTRAMUSCULAR | Status: DC | PRN
  Filled 2017-01-26: qty 1

## 2017-01-26 MED ORDER — LACTATED RINGERS IV SOLN: 500.0000 mL | INTRAVENOUS | Status: DC | PRN

## 2017-01-26 MED ORDER — OXYTOCIN BOLUS FROM INFUSION: 500.0000 mL | Freq: Once | INTRAVENOUS | Status: DC

## 2017-01-26 MED ORDER — MISOPROSTOL 25 MCG QUARTER TABLET
25.0000 ug | ORAL_TABLET | ORAL | Status: DC
  Filled 2017-01-26: qty 1

## 2017-01-26 NOTE — Progress Notes (Signed)
Foley inserted and inflated w/60cc H20. Will go with oral cytotec

## 2017-01-26 NOTE — H&P (Signed)
LABOR ADMISSION HISTORY AND PHYSICAL  Cassandra Avery is a 31 y.o. female G1P0 with IUP at [redacted]w[redacted]d by LMP c/w 18 wk [place] presenting for IOL for gHTN. She reports +FMs, No LOF, no VB, no blurry vision, no headaches, no peripheral edema, and no RUQ pain.  She plans on breast feeding. She requests POPs for birth control.  Dating: By LMP c/w 18 wk [place] --->  Estimated Date of Delivery: 01/29/17  Sono:   , CWD, normal anatomy, cephalic presentation, 2510 g, 54% EFW  Patient initiated prenatal care at [redacted]w[redacted]d at [facility]  Prenatal History/Complications: gHTN  Past Medical History: Past Medical History:  Diagnosis Date  . Gestational hypertension 01/26/2017  . Headache   . Recurrent upper respiratory infection (URI)     Past Surgical History: Past Surgical History:  Procedure Laterality Date  . NO PAST SURGERIES      Obstetrical History: OB History    Gravida Para Term Preterm AB Living   1             SAB TAB Ectopic Multiple Live Births                  Social History: Social History   Social History  . Marital status: Single    Spouse name: N/A  . Number of children: N/A  . Years of education: N/A   Social History Main Topics  . Smoking status: Never Smoker  . Smokeless tobacco: Never Used  . Alcohol use Yes     Comment: not since pregnancy  . Drug use: No  . Sexual activity: Yes     Comment: pregnant   Other Topics Concern  . None   Social History Narrative  . None    Family History: Family History  Problem Relation Age of Onset  . Miscarriages / [place] Mother   . Asthma Sister   . Stroke Paternal Grandmother   . Hearing loss Paternal Grandfather     Allergies: No Known Allergies  Prescriptions Prior to Admission  Medication Sig Dispense Refill Last Dose  . Prenatal MV-Min-FA-Omega-3 (PRENATAL GUMMIES/DHA & FA) 0.4-32.5 MG CHEW Chew 2 each by mouth at bedtime.   Taking     Review of Systems   All systems reviewed and negative except as stated in  HPI  Blood pressure (!) 145/87, pulse 95, last menstrual period 04/24/2016. General appearance: alert, cooperative, appears stated age and no distress Lungs: normal work of breathing Extremities: Homans sign is negative, no sign of DVT Presentation: cephalic Fetal monitoringBaseline: 130 bpm, Variability: Good {> 6 bpm), Accelerations: Reactive and Decelerations: Absent Uterine activityFrequency: Every 2-3 minutes Dilation: Fingertip Effacement (%): 50 Station: -2 Exam by:: Drenda Freeze CNM   Prenatal labs: ABO, Rh: O/Positive/-- (03/01 1542) Antibody: Negative (03/01 1542) Rubella: Immune (03/01 1542) RPR: Non Reactive (06/28 1130)  HBsAg: Negative (03/01 1542)  HIV:   Non Reactive (06/28 1130) GBS: Negative (08/23 1529)  GTT: Fasting- 71, 1 hr- 80, 2 hr- 88  Prenatal Transfer Tool  Maternal Diabetes: No Genetic Screening: Normal Maternal Ultrasounds/Referrals: Normal Fetal Ultrasounds or other Referrals:  None Maternal Substance Abuse:  No Significant Maternal Medications:  None Significant Maternal Lab Results: None  Results for orders placed or performed during the hospital encounter of 01/26/17 (from the past 24 hour(s))  Protein / creatinine ratio, urine   Collection Time: 01/26/17  4:43 PM  Result Value Ref Range   Creatinine, Urine 284.00 mg/dL   Total Protein, Urine 52 mg/dL   Protein  Creatinine Ratio 0.18 (H) 0.00 - 0.15 mg/mg[Cre]  CBC   Collection Time: 01/26/17  6:16 PM  Result Value Ref Range   WBC 10.1 4.0 - 10.5 K/uL   RBC 3.48 (L) 3.87 - 5.11 MIL/uL   Hemoglobin 9.5 (L) 12.0 - 15.0 g/dL   HCT 16.1 (L) 09.6 - 04.5 %   MCV 84.8 78.0 - 100.0 fL   MCH 27.3 26.0 - 34.0 pg   MCHC 32.2 30.0 - 36.0 g/dL   RDW 40.9 81.1 - 91.4 %   Platelets 228 150 - 400 K/uL  Comprehensive metabolic panel   Collection Time: 01/26/17  6:16 PM  Result Value Ref Range   Sodium 135 135 - 145 mmol/L   Potassium 3.7 3.5 - 5.1 mmol/L   Chloride 105 101 - 111 mmol/L   CO2 22 22 -  32 mmol/L   Glucose, Bld 72 65 - 99 mg/dL   BUN 6 6 - 20 mg/dL   Creatinine, Ser 7.82 0.44 - 1.00 mg/dL   Calcium 9.4 8.9 - 95.6 mg/dL   Total Protein 6.1 (L) 6.5 - 8.1 g/dL   Albumin 2.9 (L) 3.5 - 5.0 g/dL   AST 19 15 - 41 U/L   ALT 13 (L) 14 - 54 U/L   Alkaline Phosphatase 190 (H) 38 - 126 U/L   Total Bilirubin 0.6 0.3 - 1.2 mg/dL   GFR calc non Af Amer >60 >60 mL/min   GFR calc Af Amer >60 >60 mL/min   Anion gap 8 5 - 15    Patient Active Problem List   Diagnosis Date Noted  . Gestational hypertension 01/26/2017  . Supervision of normal pregnancy 09/01/2016    Assessment: Cassandra Avery is a 31 y.o. G1P0 at [redacted]w[redacted]d here for IOL for gHTN.  #Labor: will start with PO cytotec 50 mg Q4H PRN and foley bulb, then add pitocin when foley bulb is out #Pain: Epidural upon request #FWB: Cat 1 #ID: [id number] #MOF: breast #MOC:POPs #Circ:  N/A   No severe range pressures so far, last BP 122/61 - will continue to monitor and start labetalol protocol if consistently >140 systolic, >90 diastolic.  Lezlie Octave, MD Family Medicine Resident PGY-1  01/26/2017, 9:31 PM  I have seen and examined this patient and agree with the management plan.

## 2017-01-26 NOTE — MAU Note (Signed)
Urine sent to lab 

## 2017-01-26 NOTE — Anesthesia Pain Management Evaluation Note (Signed)
  CRNA Pain Management Visit Note  Patient: Cassandra Avery, 31 y.o., female  "Hello I am a member of the anesthesia team at [facility]. We have an anesthesia team available at all times to provide care throughout the hospital, including epidural management and anesthesia for C-section. I don't know your plan for the delivery whether it a natural birth, water birth, IV sedation, nitrous supplementation, doula or epidural, but we want to meet your pain goals."   1.Was your pain managed to your expectations on prior hospitalizations?   No prior hospitalizations  2.What is your expectation for pain management during this hospitalization?     Epidural and IV pain meds  3.How can we help you reach that goal? Pt expects to labor for a while, then decide if she wants an epidural.  Record the patient's initial score and the patient's pain goal.   Pain: 2  Pain Goal: 8 [facility] wants you to be able to say your pain was always managed very well.  Cassandra Avery 01/26/2017

## 2017-01-26 NOTE — MAU Provider Note (Signed)
History     CSN: [id number]  Arrival date and time: 01/26/17 1625   First Provider Initiated Contact with Patient 01/26/17 1806      Chief Complaint  Patient presents with  . Hypertension   HPI  Ms. Cassandra Avery is a 31 y.o. G1P0 [redacted]w[redacted]d gestation sent from [facility] office for elevated BPs.  She had a H/A earlier today, but none right now.  Denies visual disturbances.  (+) FM all day today.  Past Medical History:  Diagnosis Date  . Headache   . Recurrent upper respiratory infection (URI)     Past Surgical History:  Procedure Laterality Date  . NO PAST SURGERIES      Family History  Problem Relation Age of Onset  . Miscarriages / [place] Mother   . Asthma Sister   . Stroke Paternal Grandmother   . Hearing loss Paternal Grandfather     Social History  Substance Use Topics  . Smoking status: Never Smoker  . Smokeless tobacco: Never Used  . Alcohol use Yes     Comment: not since pregnancy    Allergies: No Known Allergies  Prescriptions Prior to Admission  Medication Sig Dispense Refill Last Dose  . Prenatal MV-Min-FA-Omega-3 (PRENATAL GUMMIES/DHA & FA) 0.4-32.5 MG CHEW Chew 2 each by mouth at bedtime.   Taking    Review of Systems  Constitutional: Negative.   HENT: Negative.   Eyes: Negative.   Respiratory: Negative.   Cardiovascular: Negative.   Gastrointestinal: Negative.   Endocrine: Negative.   Genitourinary: Negative.   Musculoskeletal: Negative.   Skin: Negative.   Allergic/Immunologic: Negative.   Neurological: Negative.  Negative for headaches (had H/A earlier).  Hematological: Negative.   Psychiatric/Behavioral: Negative.    Physical Exam   Blood pressure 133/83, pulse 86, last menstrual period 04/24/2016. Patient Vitals for the past 24 hrs:  BP Pulse  01/26/17 1846 133/83 86  01/26/17 1831 130/88 90  01/26/17 1816 110/83 99  01/26/17 1801 118/76 94  01/26/17 1746 112/66 88  01/26/17 1731 125/70 92  01/26/17 1717 121/68 99  01/26/17 1708  140/79 98  01/26/17 1701 (!) 148/82 100    Physical Exam  Nursing note and vitals reviewed. Constitutional: She is oriented to person, place, and time. She appears well-developed and well-nourished.  HENT:  Head: Normocephalic.  Eyes: Pupils are equal, round, and reactive to light.  Neck: Normal range of motion.  Cardiovascular: Normal rate, regular rhythm and normal heart sounds.   Respiratory: Effort normal and breath sounds normal.  GI: Soft. Bowel sounds are normal.  Genitourinary:  Genitourinary Comments: FH= 40 cm Cx: closed/thick/-2/vtx  Musculoskeletal: Normal range of motion.  BLE non-pitting edema  Neurological: She is alert and oriented to person, place, and time.  Skin: Skin is warm and dry.  Psychiatric: She has a normal mood and affect. Her behavior is normal. Judgment and thought content normal.    MAU Course  Procedures  MDM NST - FHR: 130 bpm / moderate variability / accels present / decels absent / TOCO: none  Results for orders placed or performed during the hospital encounter of 01/26/17 (from the past 24 hour(s))  Protein / creatinine ratio, urine     Status: Abnormal   Collection Time: 01/26/17  4:43 PM  Result Value Ref Range   Creatinine, Urine 284.00 mg/dL   Total Protein, Urine 52 mg/dL   Protein Creatinine Ratio 0.18 (H) 0.00 - 0.15 mg/mg[Cre]  CBC     Status: Abnormal   Collection Time:  01/26/17  6:16 PM  Result Value Ref Range   WBC 10.1 4.0 - 10.5 K/uL   RBC 3.48 (L) 3.87 - 5.11 MIL/uL   Hemoglobin 9.5 (L) 12.0 - 15.0 g/dL   HCT 16.1 (L) 09.6 - 04.5 %   MCV 84.8 78.0 - 100.0 fL   MCH 27.3 26.0 - 34.0 pg   MCHC 32.2 30.0 - 36.0 g/dL   RDW 40.9 81.1 - 91.4 %   Platelets 228 150 - 400 K/uL  Comprehensive metabolic panel     Status: Abnormal   Collection Time: 01/26/17  6:16 PM  Result Value Ref Range   Sodium 135 135 - 145 mmol/L   Potassium 3.7 3.5 - 5.1 mmol/L   Chloride 105 101 - 111 mmol/L   CO2 22 22 - 32 mmol/L   Glucose, Bld 72  65 - 99 mg/dL   BUN 6 6 - 20 mg/dL   Creatinine, Ser 7.82 0.44 - 1.00 mg/dL   Calcium 9.4 8.9 - 95.6 mg/dL   Total Protein 6.1 (L) 6.5 - 8.1 g/dL   Albumin 2.9 (L) 3.5 - 5.0 g/dL   AST 19 15 - 41 U/L   ALT 13 (L) 14 - 54 U/L   Alkaline Phosphatase 190 (H) 38 - 126 U/L   Total Bilirubin 0.6 0.3 - 1.2 mg/dL   GFR calc non Af Amer >60 >60 mL/min   GFR calc Af Amer >60 >60 mL/min   Anion gap 8 5 - 15    Assessment and Plan  Pregnancy-induced hypertension in third trimester - Admit to L&D for IOL  - Routine L&D orders - Dr. Vergie Living and on-call L&D staff assuming care of patient upon admission   Raelyn Mora, MSN, CNM 01/26/2017, 6:08 PM

## 2017-01-26 NOTE — Progress Notes (Signed)
   PRENATAL VISIT NOTE  Subjective:  Cassandra Avery is a 31 y.o. G1P0 at [redacted]w[redacted]d being seen today for ongoing prenatal care.  She is currently monitored for the following issues for this low-risk pregnancy and has Supervision of normal pregnancy on her problem list.  Patient reports headache, no bleeding, no cramping, no leaking and occasional contractions.  Contractions: Irregular. Vag. Bleeding: None.  Movement: Present. Denies leaking of fluid.   The following portions of the patient's history were reviewed and updated as appropriate: allergies, current medications, past family history, past medical history, past social history, past surgical history and problem list. Problem list updated.  Objective:   Vitals:   01/26/17 1539 01/26/17 1551  BP: (!) 148/87 (!) 146/92  Pulse: 86   Weight: 226 lb (102.5 kg)     Fetal Status: Fetal Heart Rate (bpm): 136; doppler Fundal Height: 45 cm Movement: Present     General:  Alert, oriented and cooperative. Patient is in no acute distress.  Skin: Skin is warm and dry. No rash noted.   Cardiovascular: Normal heart rate noted  Respiratory: Normal respiratory effort, no problems with respiration noted  Abdomen: Soft, gravid, appropriate for gestational age.  Pain/Pressure: Present     Pelvic: Cervical exam deferred        Extremities: Normal range of motion.     Mental Status:  Normal mood and affect. Normal behavior. Normal judgment and thought content.   Assessment and Plan:  Pregnancy: G1P0 at [redacted]w[redacted]d  1. Encounter for supervision of normal first pregnancy in third trimester     Elevated blood pressure today with increased HA.  Sent to MAU: report called to [place] at [facility].     Reports right hand nails with two lines and cannot feel those two fingers they are numb now.   Term labor symptoms and general obstetric precautions including but not limited to vaginal bleeding, contractions, leaking of fluid and fetal movement were reviewed in detail with  the patient. Please refer to After Visit Summary for other counseling recommendations.  Return in about 4 weeks (around 02/23/2017) for Postpartum.   Roe Coombs, CNM

## 2017-01-26 NOTE — MAU Note (Signed)
Sent from the office with elevated B/P. C/o headache earlier today but not right now. No visual changes reported.

## 2017-01-27 ENCOUNTER — Encounter (HOSPITAL_COMMUNITY): Payer: Self-pay | Admitting: *Deleted

## 2017-01-27 ENCOUNTER — Inpatient Hospital Stay (HOSPITAL_COMMUNITY): Payer: Medicaid Other | Admitting: Anesthesiology

## 2017-01-27 DIAGNOSIS — O134 Gestational [pregnancy-induced] hypertension without significant proteinuria, complicating childbirth: Secondary | ICD-10-CM

## 2017-01-27 DIAGNOSIS — Z3A39 39 weeks gestation of pregnancy: Secondary | ICD-10-CM

## 2017-01-27 LAB — CBC
HCT: 29.1 % — ABNORMAL LOW (ref 36.0–46.0)
Hemoglobin: 9.4 g/dL — ABNORMAL LOW (ref 12.0–15.0)
MCH: 27.4 pg (ref 26.0–34.0)
MCHC: 32.3 g/dL (ref 30.0–36.0)
MCV: 84.8 fL (ref 78.0–100.0)
PLATELETS: 218 10*3/uL (ref 150–400)
RBC: 3.43 MIL/uL — AB (ref 3.87–5.11)
RDW: 15 % (ref 11.5–15.5)
WBC: 11.9 10*3/uL — AB (ref 4.0–10.5)

## 2017-01-27 LAB — RPR: RPR Ser Ql: NONREACTIVE

## 2017-01-27 LAB — ABO/RH: ABO/RH(D): O POS

## 2017-01-27 MED ORDER — COCONUT OIL OIL: 1.0000 "application " | TOPICAL_OIL | Status: DC | PRN

## 2017-01-27 MED ORDER — LACTATED RINGERS IV SOLN: 500.0000 mL | Freq: Once | INTRAVENOUS | Status: DC

## 2017-01-27 MED ORDER — IBUPROFEN 600 MG PO TABS
600.0000 mg | ORAL_TABLET | Freq: Four times a day (QID) | ORAL | Status: DC
  Administered 2017-01-27 – 2017-01-28 (×5): 600 mg via ORAL
  Filled 2017-01-27 (×5): qty 1

## 2017-01-27 MED ORDER — PRENATAL MULTIVITAMIN CH
1.0000 | ORAL_TABLET | Freq: Every day | ORAL | Status: DC
  Administered 2017-01-28: 1 via ORAL
  Filled 2017-01-27: qty 1

## 2017-01-27 MED ORDER — WITCH HAZEL-GLYCERIN EX PADS: 1.0000 "application " | MEDICATED_PAD | CUTANEOUS | Status: DC | PRN

## 2017-01-27 MED ORDER — ZOLPIDEM TARTRATE 5 MG PO TABS
5.0000 mg | ORAL_TABLET | Freq: Every evening | ORAL | Status: DC | PRN
  Filled 2017-01-27: qty 1

## 2017-01-27 MED ORDER — DIBUCAINE 1 % RE OINT: 1.0000 "application " | TOPICAL_OINTMENT | RECTAL | Status: DC | PRN

## 2017-01-27 MED ORDER — PHENYLEPHRINE 40 MCG/ML (10ML) SYRINGE FOR IV PUSH (FOR BLOOD PRESSURE SUPPORT)
80.0000 ug | PREFILLED_SYRINGE | INTRAVENOUS | Status: DC | PRN
  Filled 2017-01-27: qty 5

## 2017-01-27 MED ORDER — DIPHENHYDRAMINE HCL 25 MG PO CAPS: 25.0000 mg | ORAL_CAPSULE | Freq: Four times a day (QID) | ORAL | Status: DC | PRN

## 2017-01-27 MED ORDER — OXYTOCIN 40 UNITS IN LACTATED RINGERS INFUSION - SIMPLE MED
1.0000 m[IU]/min | INTRAVENOUS | Status: DC
  Administered 2017-01-27: 666 m[IU]/min via INTRAVENOUS
  Administered 2017-01-27: 2 m[IU]/min via INTRAVENOUS

## 2017-01-27 MED ORDER — TETANUS-DIPHTH-ACELL PERTUSSIS 5-2.5-18.5 LF-MCG/0.5 IM SUSP: 0.5000 mL | Freq: Once | INTRAMUSCULAR | Status: DC

## 2017-01-27 MED ORDER — FENTANYL 2.5 MCG/ML BUPIVACAINE 1/10 % EPIDURAL INFUSION (WH - ANES)
INTRAMUSCULAR | Status: AC
Start: 2017-01-27 — End: 2017-01-27
  Filled 2017-01-27: qty 100

## 2017-01-27 MED ORDER — ZOLPIDEM TARTRATE 5 MG PO TABS: 5.0000 mg | ORAL_TABLET | Freq: Every evening | ORAL | Status: DC | PRN

## 2017-01-27 MED ORDER — DIPHENHYDRAMINE HCL 50 MG/ML IJ SOLN: 12.5000 mg | INTRAMUSCULAR | Status: DC | PRN

## 2017-01-27 MED ORDER — SIMETHICONE 80 MG PO CHEW: 80.0000 mg | CHEWABLE_TABLET | ORAL | Status: DC | PRN

## 2017-01-27 MED ORDER — SENNOSIDES-DOCUSATE SODIUM 8.6-50 MG PO TABS
2.0000 | ORAL_TABLET | ORAL | Status: DC
  Administered 2017-01-27: 2 via ORAL
  Filled 2017-01-27: qty 2

## 2017-01-27 MED ORDER — LIDOCAINE HCL (PF) 1 % IJ SOLN
INTRAMUSCULAR | Status: DC | PRN
  Administered 2017-01-27 (×2): 5 mL

## 2017-01-27 MED ORDER — EPHEDRINE 5 MG/ML INJ
10.0000 mg | INTRAVENOUS | Status: DC | PRN
  Filled 2017-01-27: qty 2

## 2017-01-27 MED ORDER — BENZOCAINE-MENTHOL 20-0.5 % EX AERO
1.0000 "application " | INHALATION_SPRAY | CUTANEOUS | Status: DC | PRN
  Filled 2017-01-27: qty 56

## 2017-01-27 MED ORDER — SODIUM BICARBONATE 8.4 % IV SOLN
INTRAVENOUS | Status: DC | PRN
  Administered 2017-01-27 (×2): 5 mL via EPIDURAL

## 2017-01-27 MED ORDER — FENTANYL 2.5 MCG/ML BUPIVACAINE 1/10 % EPIDURAL INFUSION (WH - ANES)
14.0000 mL/h | INTRAMUSCULAR | Status: DC | PRN
  Administered 2017-01-27 (×2): 14 mL/h via EPIDURAL
  Filled 2017-01-27: qty 100

## 2017-01-27 MED ORDER — PHENYLEPHRINE 40 MCG/ML (10ML) SYRINGE FOR IV PUSH (FOR BLOOD PRESSURE SUPPORT)
PREFILLED_SYRINGE | INTRAVENOUS | Status: AC
  Filled 2017-01-27: qty 20

## 2017-01-27 MED ORDER — ACETAMINOPHEN 325 MG PO TABS: 650.0000 mg | ORAL_TABLET | ORAL | Status: DC | PRN

## 2017-01-27 MED ORDER — TERBUTALINE SULFATE 1 MG/ML IJ SOLN
0.2500 mg | Freq: Once | INTRAMUSCULAR | Status: DC | PRN
  Filled 2017-01-27: qty 1

## 2017-01-27 MED ORDER — ONDANSETRON HCL 4 MG PO TABS: 4.0000 mg | ORAL_TABLET | ORAL | Status: DC | PRN

## 2017-01-27 MED ORDER — ONDANSETRON HCL 4 MG/2ML IJ SOLN: 4.0000 mg | INTRAMUSCULAR | Status: DC | PRN

## 2017-01-27 NOTE — Progress Notes (Addendum)
Labor Progress Note  Cassandra Avery is a 31 y.o. G1P0 at [redacted]w[redacted]d  admitted for induction of labor due to Hypertension.  S: Patient resting comfortably in bed after epidural   O:  BP 124/67   Pulse 98   Temp 98.9 F (37.2 C) (Oral)   Resp 16   Ht  (1.575 m)   Wt 102.5 kg (226 lb)   LMP 04/24/2016 (Exact Date)   SpO2 99%   BMI 41.34 kg/m   No intake/output data recorded.  FHT:  FHR: 140 bpm, variability: moderate,  accelerations:  Abscent,  decelerations:  Present early, variable UC:   regular, every 2-4 minutes SVE:   Dilation: 9 Effacement (%): 90 Station: -1 Exam by:: [employer], rn SROM: light mec  Pitocin @ 10 mu/min  Labs: Lab Results  Component Value Date   WBC 11.9 (H) 01/27/2017   HGB 9.4 (L) 01/27/2017   HCT 29.1 (L) 01/27/2017   MCV 84.8 01/27/2017   PLT 218 01/27/2017    Assessment / Plan: 31 y.o. G1P0 [redacted]w[redacted]d in active labor Induction of labor due to gestational hypertension,  progressing well on pitocin  Labor: Progressing on Pitocin.  GHTN: BPs wnl Fetal Wellbeing:  Category II Pain Control:  Epidural Anticipated MOD:  NSVD  Expectant management   Caryl Ada, DO OB Fellow 01/27/2017, 10:30 AM

## 2017-01-27 NOTE — Progress Notes (Signed)
Vitals:   01/26/17 2307 01/26/17 2354  BP: 128/67 140/73  Pulse: 85 80  Resp: 18 18  Temp: 98.4 F (36.9 C)    Foley out. Pt wants to eat and shower and then will start pitocin. FHR Cat 1. occ ctx

## 2017-01-27 NOTE — Anesthesia Preprocedure Evaluation (Signed)
Anesthesia Evaluation  Patient identified by MRN, date of birth, ID band Patient awake    Reviewed: Allergy & Precautions, H&P , NPO status , Patient's Chart, lab work & pertinent test results  History of Anesthesia Complications Negative for: history of anesthetic complications  Airway Mallampati: II  TM Distance: >3 FB Neck ROM: full    Dental no notable dental hx. (+) Teeth Intact   Pulmonary neg pulmonary ROS,    Pulmonary exam normal breath sounds clear to auscultation       Cardiovascular hypertension, negative cardio ROS Normal cardiovascular exam Rhythm:regular Rate:Normal     Neuro/Psych negative neurological ROS  negative psych ROS   GI/Hepatic negative GI ROS, Neg liver ROS,   Endo/Other  Morbid obesity  Renal/GU negative Renal ROS  negative genitourinary   Musculoskeletal   Abdominal   Peds  Hematology negative hematology ROS (+)   Anesthesia Other Findings   Reproductive/Obstetrics (+) Pregnancy                             Anesthesia Physical Anesthesia Plan  ASA: III  Anesthesia Plan: Epidural   Post-op Pain Management:    Induction:   PONV Risk Score and Plan:   Airway Management Planned:   Additional Equipment:   Intra-op Plan:   Post-operative Plan:   Informed Consent: I have reviewed the patients History and Physical, chart, labs and discussed the procedure including the risks, benefits and alternatives for the proposed anesthesia with the patient or authorized representative who has indicated his/her understanding and acceptance.       Plan Discussed with:   Anesthesia Plan Comments:         Anesthesia Quick Evaluation  

## 2017-01-27 NOTE — Progress Notes (Signed)
Vitals:   01/27/17 0600 01/27/17 0630  BP: (!) 110/50 113/68  Pulse: 83 84  Resp: 18 18  Temp:    SpO2:     Pt sleeping. Was noted to be 9.5/100/-2 a little while ago.  FHR Cat 1.  Will labor down until urge to push

## 2017-01-27 NOTE — Anesthesia Procedure Notes (Signed)
Epidural Patient location during procedure: OB  Staffing Anesthesiologist: Kainoa Swoboda Performed: anesthesiologist   Preanesthetic Checklist Completed: patient identified, site marked, surgical consent, pre-op evaluation, timeout performed, IV checked, risks and benefits discussed and monitors and equipment checked  Epidural Patient position: sitting Prep: DuraPrep Patient monitoring: heart rate, continuous pulse ox and blood pressure Approach: right paramedian Location: L3-L4 Injection technique: LOR saline  Needle:  Needle type: Tuohy  Needle gauge: 17 G Needle length: 9 cm and 9 Needle insertion depth: 7 cm Catheter type: closed end flexible Catheter size: 20 Guage Catheter at skin depth: 11 cm Test dose: negative  Assessment Events: blood not aspirated, injection not painful, no injection resistance, negative IV test and no paresthesia  Additional Notes Patient identified. Risks/Benefits/Options discussed with patient including but not limited to bleeding, infection, nerve damage, paralysis, failed block, incomplete pain control, headache, blood pressure changes, nausea, vomiting, reactions to medication both or allergic, itching and postpartum back pain. Confirmed with bedside nurse the patient's most recent platelet count. Confirmed with patient that they are not currently taking any anticoagulation, have any bleeding history or any family history of bleeding disorders. Patient expressed understanding and wished to proceed. All questions were answered. Sterile technique was used throughout the entire procedure. Please see nursing notes for vital signs. Test dose was given through epidural needle and negative prior to continuing to dose epidural or start infusion. Warning signs of high block given to the patient including shortness of breath, tingling/numbness in hands, complete motor block, or any concerning symptoms with instructions to call for help. Patient was given  instructions on fall risk and not to get out of bed. All questions and concerns addressed with instructions to call with any issues.     

## 2017-01-27 NOTE — Lactation Note (Signed)
This note was copied from a baby's chart. Lactation Consultation Note  Patient Name: Cassandra Avery [id number] Date: 01/27/2017 Reason for consult: Initial assessment Baby at 30 minutes of life. Upon entry baby was sts with mom. Baby was quiet alert. She was had a very wet mouth and spit out bubbles several times during the consult. Mom was able to manually express large drops of colostrum bilaterally. Reviewed spoon feeding. Discussed baby behavior, feeding frequency, baby belly size, voids, wt loss, breast changes, and nipple care. Given lactation handouts. Aware of OP services and support group.    Maternal Data Has patient been taught Hand Expression?: Yes Does the patient have breastfeeding experience prior to this delivery?: No  Feeding Feeding Type: Breast Fed Length of feed: 0 min  LATCH Score Latch: Too sleepy or reluctant, no latch achieved, no sucking elicited.  Audible Swallowing: None  Type of Nipple: Everted at rest and after stimulation  Comfort (Breast/Nipple): Soft / non-tender  Hold (Positioning): Full assist, staff holds infant at breast  LATCH Score: 4  Interventions Interventions: Breast feeding basics reviewed;Assisted with latch;Skin to skin;Breast massage;Hand express;Support pillows;Position options  Lactation Tools Discussed/Used WIC Program: Yes   Consult Status Consult Status: Follow-up Date: 01/28/17 Follow-up type: In-patient    Rulon Eisenmenger 01/27/2017, 2:18 PM

## 2017-01-28 MED ORDER — IBUPROFEN 600 MG PO TABS: 600.0000 mg | ORAL_TABLET | Freq: Four times a day (QID) | ORAL | 0 refills | Status: DC

## 2017-01-28 MED ORDER — NORETHINDRONE 0.35 MG PO TABS: 1.0000 | ORAL_TABLET | Freq: Every day | ORAL | 11 refills | Status: DC

## 2017-01-28 NOTE — Anesthesia Postprocedure Evaluation (Signed)
Anesthesia Post Note  Patient: Cassandra Avery  Procedure(s) Performed: * No procedures listed *     Patient location during evaluation: Mother Baby Anesthesia Type: Epidural Level of consciousness: awake and alert, oriented and patient cooperative Pain management: pain level controlled Vital Signs Assessment: post-procedure vital signs reviewed and stable Respiratory status: spontaneous breathing Cardiovascular status: stable Postop Assessment: no headache, epidural receding, patient able to bend at knees and no signs of nausea or vomiting Anesthetic complications: no Comments: Pain Score 2.    Last Vitals:  Vitals:   01/27/17 2100 01/28/17 0550  BP: 130/68 (!) 122/59  Pulse: 75 72  Resp: 18 18  Temp: 37 C 36.7 C  SpO2: 98% 99%    Last Pain:  Vitals:   01/28/17 1151  TempSrc:   PainSc: 3    Pain Goal: Patients Stated Pain Goal: 3 (01/28/17 1151)               Merrilyn Puma

## 2017-01-28 NOTE — Lactation Note (Signed)
This note was copied from a baby's chart. Lactation Consultation Note New mom needing latching help. LC entered rm. Baby in supine position in football position. Could see nipple, baby on tip of nipple. Discussed positions. Assisted in football, discussed proper body alignment. Hand express colostrum.  Mom has large soft breast. Cloth rolled and placed under breast for support. Taught "C" hold. Answered questions mom had. Taught hand expression, breast massage.  No swallows heard. Say good breast compression as nursing. Before left rm. Baby suckling at intervals. Educated newborn feeding habits and behavior. Patient Name: Cassandra Avery [id number] Date: 01/28/2017 Reason for consult: Follow-up assessment;Difficult latch   Maternal Data    Feeding Feeding Type: Breast Fed Length of feed: 1 min (still breast)  LATCH Score Latch: Repeated attempts needed to sustain latch, nipple held in mouth throughout feeding, stimulation needed to elicit sucking reflex.  Audible Swallowing: None  Type of Nipple: Everted at rest and after stimulation  Comfort (Breast/Nipple): Soft / non-tender  Hold (Positioning): Assistance needed to correctly position infant at breast and maintain latch.  LATCH Score: 6  Interventions Interventions: Breast feeding basics reviewed;Support pillows;Assisted with latch;Position options;Skin to skin;Breast massage;Hand express;Breast compression;Adjust position  Lactation Tools Discussed/Used     Consult Status Consult Status: Follow-up Date: 01/28/17 Follow-up type: In-patient    [name] 01/28/2017, 2:58 AM

## 2017-01-28 NOTE — Progress Notes (Signed)
Post Partum Day 1  Subjective:  Cassandra Avery is a 31 y.o. G1P1001 [redacted]w[redacted]d s/p NSVD.  No acute events overnight.  Pt denies problems with ambulating, voiding or po intake.  She denies nausea or vomiting.  Pain is well controlled.  She has not had flatus. She has not had bowel movement.  Lochia Moderate.  Plan for birth control is oral progesterone-only contraceptive.  Method of Feeding: breast  Objective: BP (!) 122/59 (BP Location: Right Arm)   Pulse 72   Temp 98 F (36.7 C) (Oral)   Resp 18   Ht  (1.575 m)   Wt 102.5 kg (226 lb)   LMP 04/24/2016 (Exact Date)   SpO2 99%   Breastfeeding   BMI 41.34 kg/m   Physical Exam:  General: alert, cooperative and no distress Lochia:normal flow Chest: no increased work of breathing Abdomen: soft, nontender, fundus firm at/below umbilicus Uterine Fundus: firm DVT Evaluation: No evidence of DVT seen on physical exam. Extremities: No edema   Recent Labs  01/26/17 1816 01/27/17 0333  HGB 9.5* 9.4*  HCT 29.5* 29.1*    Assessment/Plan:  ASSESSMENT: Cassandra Avery is a 31 y.o. G1P1001 [redacted]w[redacted]d ppd #1 s/p NSVD doing well.   Blood pressures have been within normal limits overnight.  Will continue to monitor.  Plan for discharge tomorrow and Breastfeeding   LOS: 2 days   Amanda C. Frances Furbish, MD PGY-1, [facility] 01/28/2017 7:50 AM  I confirm that I have verified the information documented in the resident's note and that I have also personally reperformed the physical exam and all medical decision making activities.Patient desires to be d/c'd home today.  Ok for d/c home.  Raelyn Mora, CNM  01/28/2017 9:50 AM

## 2017-01-28 NOTE — Discharge Summary (Signed)
OB Discharge Summary  Patient Name: Cassandra Avery DOB: 24-Dec-1985 MRN: [id number]  Date of admission: 01/26/2017 Delivering MD: Caryl Ada Y   Date of discharge: 01/28/2017  Admitting diagnosis: 39.4WKS HIGH BP Intrauterine pregnancy: [redacted]w[redacted]d     Secondary diagnosis:Active Problems:   Gestational hypertension   SVD (spontaneous vaginal delivery)  Additional problems:none     Discharge diagnosis: Term Pregnancy Delivered and Gestational Hypertension                                                                     Post partum procedures:none  Augmentation: Pitocin and Cytotec  Complications: None  Hospital course:  Induction of Labor With Vaginal Delivery   31 y.o. yo G1P1001 at [redacted]w[redacted]d was admitted to the hospital 01/26/2017 for induction of labor.  Indication for induction: Gestational hypertension.  Patient had an uncomplicated labor course as follows: Membrane Rupture Time/Date: 8:09 AM ,01/27/2017   Intrapartum Procedures: Episiotomy: None [1]                                         Lacerations:  None [1]  Patient had delivery of a Viable infant.  Information for the patient's newborn:  Taleah, [place] Girl Gratia [[id number]]  Delivery Method: Vag-Spont   01/27/2017  Details of delivery can be found in separate delivery note.  Patient had a routine postpartum course. Patient is discharged home 01/28/17.  Physical exam  Vitals:   01/27/17 1515 01/27/17 1618 01/27/17 2100 01/28/17 0550  BP: 139/75 129/68 130/68 (!) 122/59  Pulse: 77 75 75 72  Resp: Temp: 98.5 F (36.9 C) 98.2 F (36.8 C) 98.6 F (37 C) 98 F (36.7 C)  TempSrc: Oral Oral Oral Oral  SpO2:   98% 99%  Weight:      Height:       General: alert, cooperative and no distress Lochia: appropriate Uterine Fundus: firm Incision: N/A DVT Evaluation: No evidence of DVT seen on physical exam. Labs: Lab Results  Component Value Date   WBC 11.9 (H) 01/27/2017   HGB 9.4 (L) 01/27/2017   HCT 29.1  (L) 01/27/2017   MCV 84.8 01/27/2017   PLT 218 01/27/2017   CMP Latest Ref Rng & Units 01/26/2017  Glucose 65 - 99 mg/dL 72  BUN 6 - 20 mg/dL 6  Creatinine 7.82 - 9.56 mg/dL 2.13  Sodium 086 - 578 mmol/L 135  Potassium 3.5 - 5.1 mmol/L 3.7  Chloride 101 - 111 mmol/L 105  CO2 22 - 32 mmol/L 22  Calcium 8.9 - 10.3 mg/dL 9.4  Total Protein 6.5 - 8.1 g/dL 6.1(L)  Total Bilirubin 0.3 - 1.2 mg/dL 0.6  Alkaline Phos 38 - 126 U/L 190(H)  AST 15 - 41 U/L 19  ALT 14 - 54 U/L 13(L)    Discharge instruction: per After Visit Summary and "Baby and Me Booklet".  After Visit Meds:  Allergies as of 01/28/2017   No Known Allergies     Medication List    TAKE these medications   ibuprofen 600 MG tablet Commonly known as:  ADVIL,MOTRIN Take 1 tablet (600 mg total) by  mouth every 6 (six) hours.   PRENATAL GUMMIES/DHA & FA 0.4-32.5 MG Chew Chew 2 each by mouth at bedtime.            Discharge Care Instructions        Start     Ordered   01/28/17 0000  ibuprofen (ADVIL,MOTRIN) 600 MG tablet  Every 6 hours     01/28/17 0949      Diet: routine diet  Activity: Advance as tolerated. Pelvic rest for 6 weeks.   Outpatient follow up:6 weeks Follow up Appt:No future appointments. Follow up visit: No Follow-up on file.  Postpartum contraception: Progesterone only pills  Newborn Data: Live born female  Birth Weight: 6 lb 14.2 oz (3125 g) APGAR: 4, 7  Baby Feeding: Breast Disposition:home with mother   01/28/2017 Wyvonnia Dusky, CNM

## 2017-01-29 ENCOUNTER — Ambulatory Visit: Payer: Self-pay

## 2017-01-29 NOTE — Lactation Note (Signed)
This note was copied from a baby's chart. Lactation Consultation Note  Patient Name: Cassandra Avery VZDGL'O Date: 01/29/2017 Reason for consult: Follow-up assessment    With this first time mom and term baby. Mom states she is pumping and bottle feeding, and will try latching later. I saw hunger cues, baby fussy in mom's arms. I asked if she wanted to pump then, since her baby was hungry. She decided to try latching. I positioned mom and baby for football hold, and hand expressed colostrum easily, and baby latched easily, strong suckles and lots of audible swallows. Mom states latch comfortable. Mom smiling - dad also. I praised mom for trying, and tole her she could breast feed. I told her position and comfort prior to latch is important, and she has a very willing baby. I advised mom to pump for comfort, and if she wants to supplement the baby to use her EBM, instead of fomrula. Breast milk storage guidelines reviewed, and mom knows to call for lactation as needed. Mom has a DEP at home. I told mom to call lactation back if she needed help with latching again, before going home.   Maternal Data    Feeding Feeding Type: Breast Fed Nipple Type: Slow - flow Length of feed:  (still feeding when I left)  LATCH Score Latch: Grasps breast easily, tongue down, lips flanged, rhythmical sucking.  Audible Swallowing: Spontaneous and intermittent  Type of Nipple: Everted at rest and after stimulation  Comfort (Breast/Nipple): Soft / non-tender (flowing colostrum)  Hold (Positioning): Assistance needed to correctly position infant at breast and maintain latch.  LATCH Score: 9  Interventions Interventions: Breast feeding basics reviewed;Assisted with latch;Hand express;Adjust position;Support pillows;Position options;DEBP  Lactation Tools Discussed/Used     Consult Status Consult Status: Complete Follow-up type: Call as needed    Alfred Levins 01/29/2017, 8:06 AM

## 2017-02-23 ENCOUNTER — Ambulatory Visit (INDEPENDENT_AMBULATORY_CARE_PROVIDER_SITE_OTHER): Payer: Medicaid Other | Admitting: Obstetrics & Gynecology

## 2017-02-23 ENCOUNTER — Encounter: Payer: Self-pay | Admitting: Obstetrics & Gynecology

## 2017-02-23 NOTE — Patient Instructions (Signed)
Return to clinic for any scheduled appointments or for any gynecologic concerns as needed.   

## 2017-02-23 NOTE — Progress Notes (Signed)
Post Partum Exam  Cassandra Avery is a 31 y.o. 391P1001 female who presents for a postpartum visit. She is 4 weeks postpartum following a spontaneous vaginal delivery. I have fully reviewed the prenatal and intrapartum course. The delivery was at 39 gestational weeks; was induced for gestational hypertension.  Anesthesia: epidural. Postpartum course has been good. Baby's course has been good. Baby is feeding by both breast and bottle - gerber. Bleeding staining only. Bowel function is normal. Bladder function is normal. Patient is not sexually active. Contraception method is oral progesterone-only contraceptive. Postpartum depression screening:neg  The following portions of the patient's history were reviewed and updated as appropriate: allergies, current medications, past family history, past medical history, past social history, past surgical history and problem list. Normal pap 07/07/2016.  Review of Systems Pertinent items noted in HPI and remainder of comprehensive ROS otherwise negative.    Objective:  Blood pressure 117/77, pulse 71, height 5\' 2"  (1.575 m), weight 202 lb (91.6 kg), currently breastfeeding.  General:  alert and no distress   Breasts:  inspection negative, no nipple discharge or bleeding, no masses or nodularity palpable  Lungs: clear to auscultation bilaterally  Heart:  regular rate and rhythm  Abdomen: soft, non-tender; bowel sounds normal; no masses,  no organomegaly   Pelvis:  not evaluated   Assessment:   Normal postpartum exam. Normal BP.  Plan:   1. Contraception: oral progesterone-only contraceptive for now. Advised to switch to combined OCPs once done with breastfeeding. 2. Follow up as needed.    Jaynie CollinsUGONNA  Cassandra Istre, MD, FACOG Attending Obstetrician & Gynecologist, [facility], [facility]

## 2017-03-29 ENCOUNTER — Encounter: Payer: Self-pay | Admitting: *Deleted

## 2019-01-23 ENCOUNTER — Other Ambulatory Visit: Payer: Self-pay

## 2019-01-23 DIAGNOSIS — Z20822 Contact with and (suspected) exposure to covid-19: Secondary | ICD-10-CM

## 2019-01-24 LAB — NOVEL CORONAVIRUS, NAA: SARS-CoV-2, NAA: NOT DETECTED

## 2019-04-17 IMAGING — US US MFM OB FOLLOW-UP
1 series · 14 of 28 positions shown · non-contrast
Comparison: none

[Series 1: us mfm ob follow-up · 41 acquisitions, 14 frames shown]
[im 2/41]
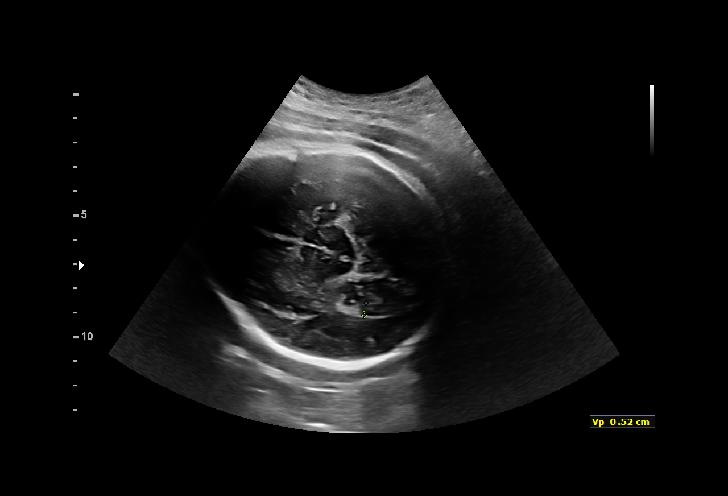
[im 5/41]
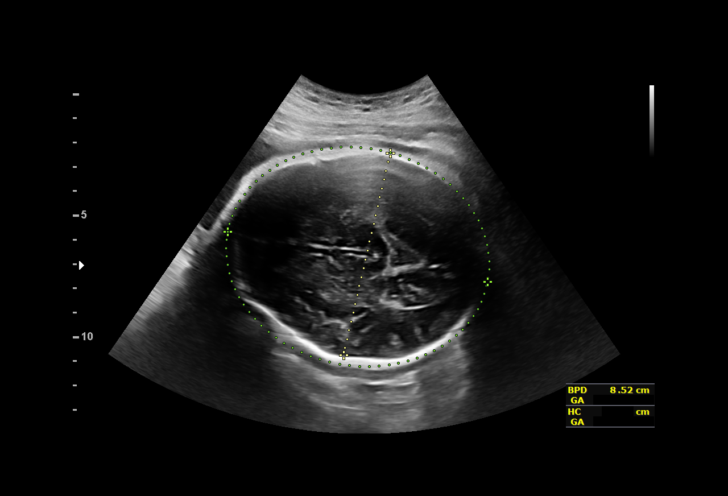
[im 8/41]
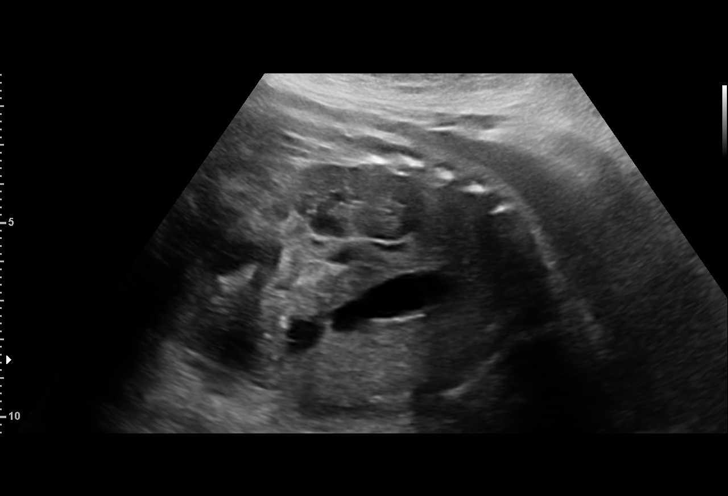
[im 11/41]
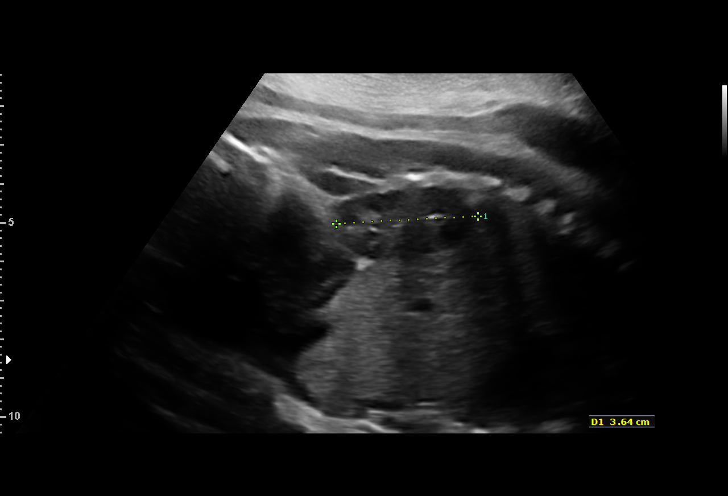
[im 14/41]
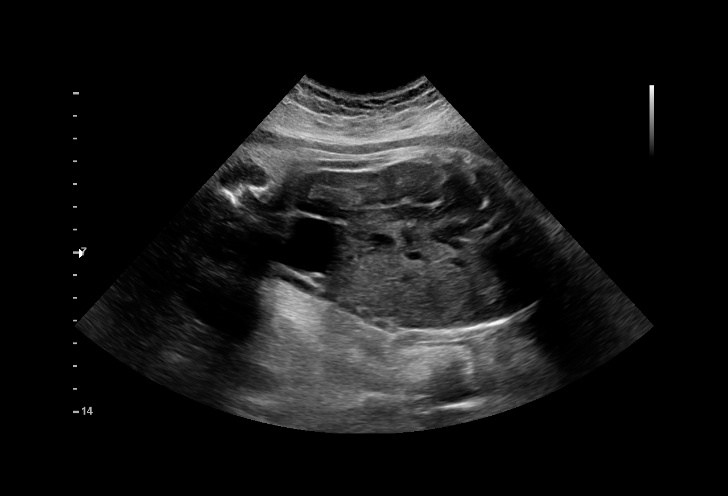
[im 17/41]
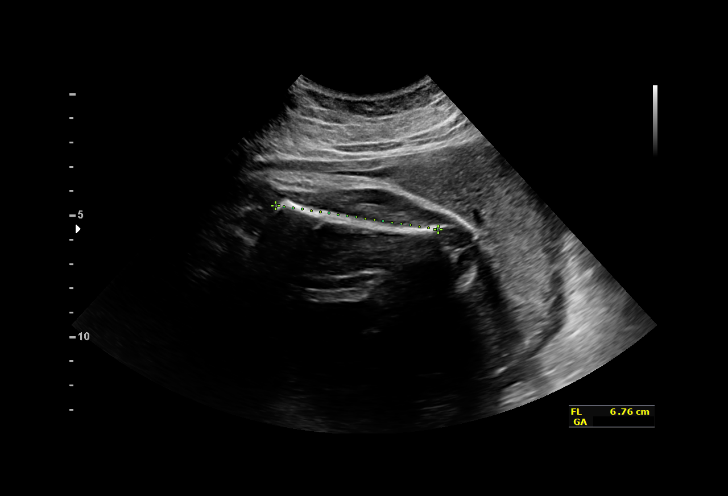
[im 20/41]
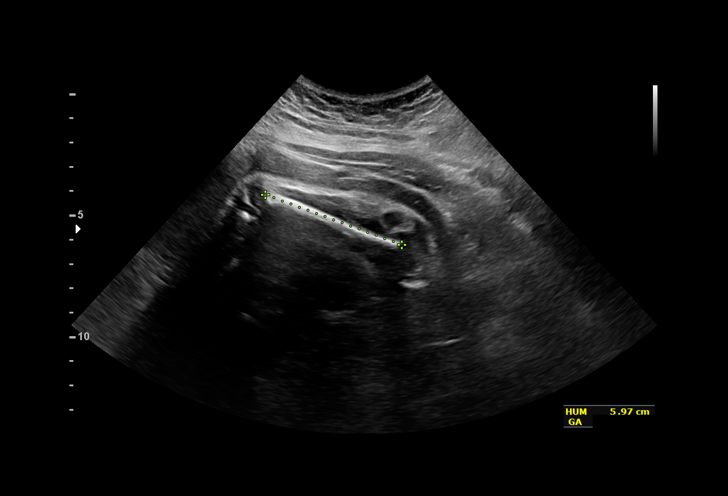
[im 23/41]
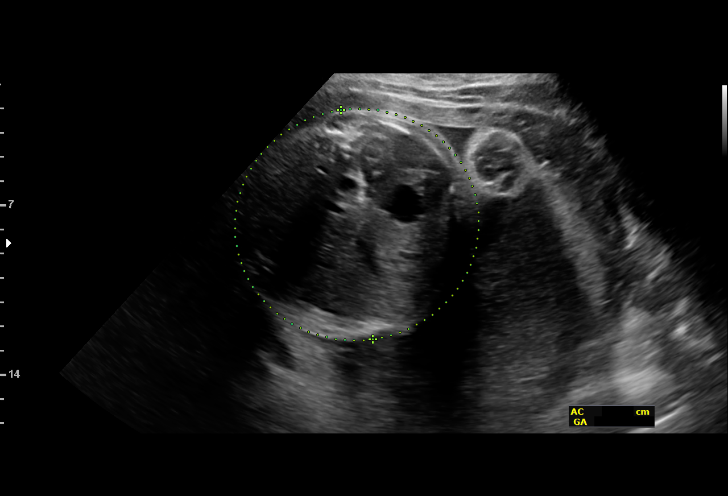
[im 26/41]
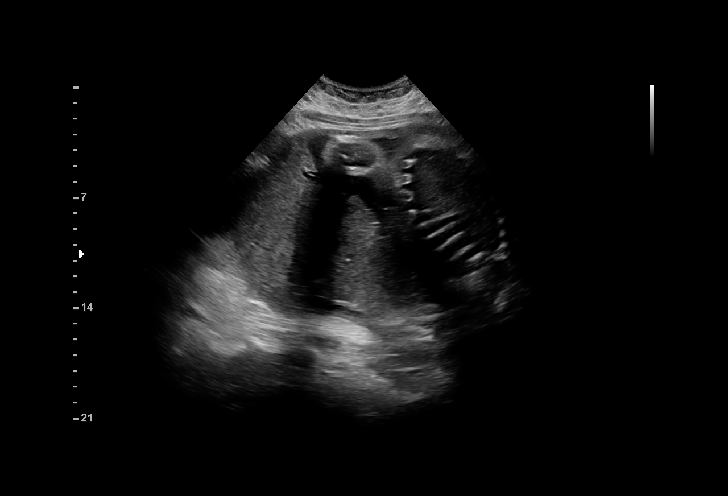
[im 29/41]
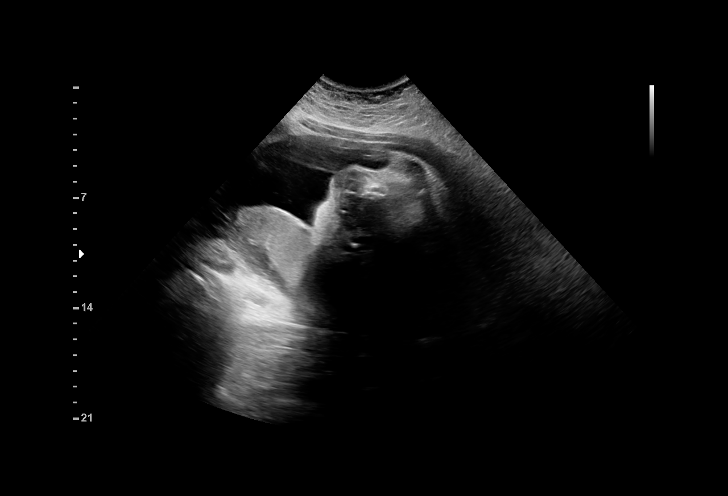
[im 32/41]
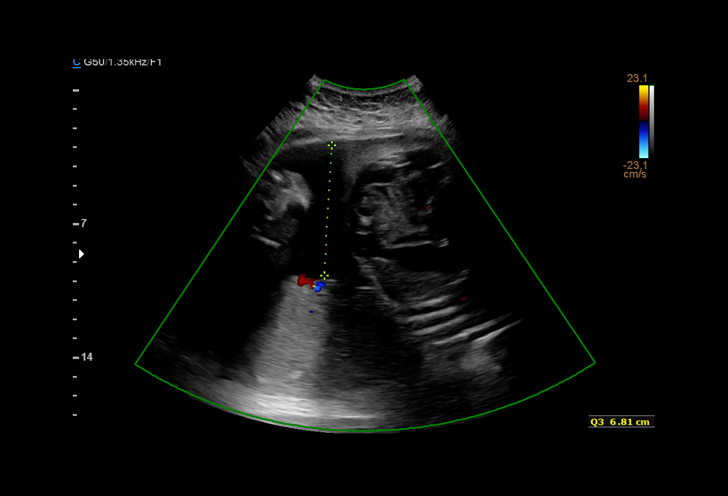
[im 35/41]
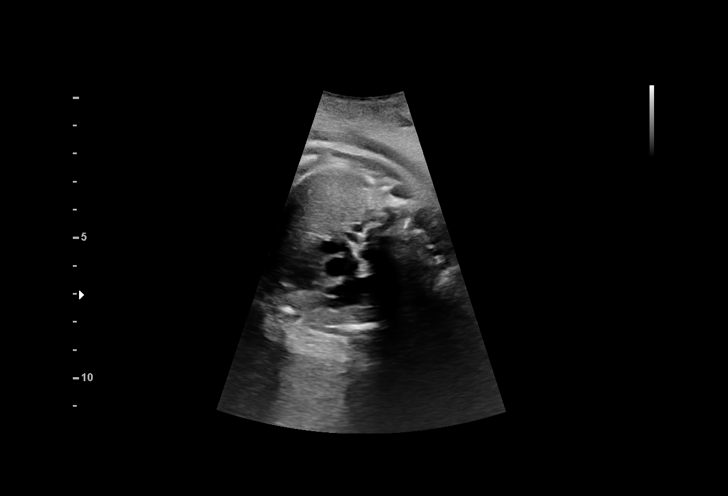
[im 38/41]
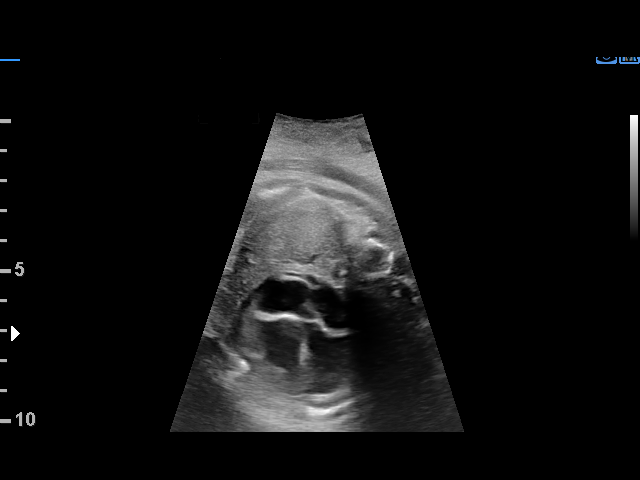
[im 41/41]
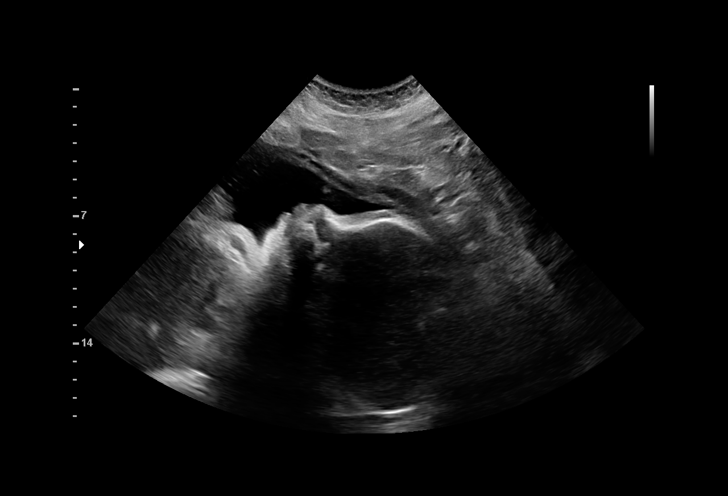

[14 of 28 positions shown; findings below may reference images not displayed]

Indications

35 weeks gestation of pregnancy
Obesity complicating pregnancy, second
trimester
Encounter for other antenatal screening
follow-up
Size-Date Discrepancy
OB History

Blood Type:            Height:  5'2"   Weight (lb):  208       BMI:
Gravidity:    1         Term:   0        Prem:   0        SAB:   0
TOP:          0       Ectopic:  0        Living: 0
Fetal Evaluation

Num Of Fetuses:     1
Fetal Heart         138
Rate(bpm):
Cardiac Activity:   Observed
Presentation:       Cephalic
Placenta:           Posterior, above cervical os
P. Cord Insertion:  Previously Visualized

Amniotic Fluid
AFI FV:      Subjectively within normal limits

AFI Sum(cm)     %Tile       Largest Pocket(cm)
19.76           74

RUQ(cm)       RLQ(cm)       LUQ(cm)        LLQ(cm)
3.71
Biometry

BPD:        86  mm     G. Age:  34w 5d         39  %    CI:        77.08   %    70 - 86
FL/HC:      22.0   %    20.1 -
HC:      310.2  mm     G. Age:  34w 4d         11  %    HC/AC:      1.01        0.93 -
AC:      307.4  mm     G. Age:  34w 5d         44  %    FL/BPD:     79.2   %    71 - 87
FL:       68.1  mm     G. Age:  35w 0d         39  %    FL/AC:      22.2   %    20 - 24
HUM:        59  mm     G. Age:  34w 1d         47  %

Est. FW:    3573  gm      5 lb 9 oz     54  %
Gestational Age

LMP:           35w 1d        Date:  04/24/16                 EDD:   01/29/17
U/S Today:     34w 5d                                        EDD:   02/01/17
Best:          35w 1d     Det. By:  LMP  (04/24/16)          EDD:   01/29/17
Anatomy

Cranium:               Appears normal         Aortic Arch:            Not well visualized
Cavum:                 Previously seen        Ductal Arch:            Not well visualized
Ventricles:            Appears normal         Diaphragm:              Not well visualized
Choroid Plexus:        Previously seen        Stomach:                Appears normal, left
sided
Cerebellum:            Previously seen        Abdomen:                Previously seen
Posterior Fossa:       Previously seen        Abdominal Wall:         Previously seen
Nuchal Fold:           Previously seen        Cord Vessels:           Previously seen
Face:                  Orbits and profile     Kidneys:                Appear normal
previously seen
Lips:                  Previously seen        Bladder:                Appears normal
Thoracic:              Appears normal         Spine:                  Previously seen
Heart:                 Appears normal         Upper Extremities:      Previously seen
(4CH, axis, and situs
RVOT:                  Not well visualized    Lower Extremities:      Previously seen
LVOT:                  Previously seen

Other:  Technically difficult due to maternal habitus and fetal position.
Cervix Uterus Adnexa

Cervix
Not visualized (advanced GA >21wks)
Impression

SIUP at 35+1 weeks here to assess size-dates discrepancy
Normal interval anatomy
Normal amniotic fluid volume with AFI 19 cm
Appropriate interval growth with EFW at the 54th percentile
Recommendations

No evidence of fetal growth abnormality
Follow-up ultrasounds as clinically indicated.

## 2020-05-18 ENCOUNTER — Other Ambulatory Visit: Payer: Self-pay

## 2020-05-18 DIAGNOSIS — Z20822 Contact with and (suspected) exposure to covid-19: Secondary | ICD-10-CM

## 2020-05-21 LAB — NOVEL CORONAVIRUS, NAA: SARS-CoV-2, NAA: NOT DETECTED

## 2020-06-02 ENCOUNTER — Encounter: Payer: Self-pay | Admitting: Obstetrics & Gynecology

## 2021-10-19 ENCOUNTER — Ambulatory Visit (INDEPENDENT_AMBULATORY_CARE_PROVIDER_SITE_OTHER): Payer: Self-pay

## 2021-10-19 DIAGNOSIS — Z3200 Encounter for pregnancy test, result unknown: Secondary | ICD-10-CM

## 2021-10-19 DIAGNOSIS — Z3201 Encounter for pregnancy test, result positive: Secondary | ICD-10-CM

## 2021-10-19 LAB — POCT URINE PREGNANCY: Preg Test, Ur: POSITIVE — AB

## 2021-10-19 NOTE — Progress Notes (Signed)
..  Cassandra Avery presents today for UPT. She has no unusual complaints. LMP: 09/04/21    OBJECTIVE: Appears well, in no apparent distress.  OB History     Gravida  2   Para  1   Term  1   Preterm  0   AB  0   Living  1      SAB  0   IAB  0   Ectopic  0   Multiple      Live Births  1          Home UPT Result:Positive In-Office UPT result:Positive I have reviewed the patient's medical, obstetrical, social, and family histories, and medications.   ASSESSMENT: Positive pregnancy test  PLAN Prenatal care to be completed at: [facility]

## 2021-11-02 ENCOUNTER — Ambulatory Visit (INDEPENDENT_AMBULATORY_CARE_PROVIDER_SITE_OTHER): Payer: 59

## 2021-11-02 VITALS — BP 119/74 | HR 80 | Ht 62.0 in | Wt 226.0 lb

## 2021-11-02 DIAGNOSIS — Z3480 Encounter for supervision of other normal pregnancy, unspecified trimester: Secondary | ICD-10-CM

## 2021-11-02 DIAGNOSIS — O3680X0 Pregnancy with inconclusive fetal viability, not applicable or unspecified: Secondary | ICD-10-CM | POA: Diagnosis not present

## 2021-11-02 DIAGNOSIS — O219 Vomiting of pregnancy, unspecified: Secondary | ICD-10-CM

## 2021-11-02 MED ORDER — DOXYLAMINE-PYRIDOXINE 10-10 MG PO TBEC: 2.0000 | DELAYED_RELEASE_TABLET | Freq: Every day | ORAL | 5 refills | Status: DC

## 2021-11-02 MED ORDER — BLOOD PRESSURE KIT DEVI: 1.0000 | 0 refills | Status: DC

## 2021-11-23 ENCOUNTER — Ambulatory Visit (INDEPENDENT_AMBULATORY_CARE_PROVIDER_SITE_OTHER): Payer: Self-pay | Admitting: Obstetrics and Gynecology

## 2021-11-23 ENCOUNTER — Other Ambulatory Visit (HOSPITAL_COMMUNITY)
Admission: RE | Admit: 2021-11-23 | Discharge: 2021-11-23 | Disposition: A | Discharge status: Home / Self Care | Payer: 59 | Source: Ambulatory Visit | Attending: Obstetrics and Gynecology | Admitting: Obstetrics and Gynecology

## 2021-11-23 ENCOUNTER — Encounter: Payer: Self-pay | Admitting: Obstetrics and Gynecology

## 2021-11-23 VITALS — BP 112/76 | HR 92 | Wt 221.4 lb

## 2021-11-23 DIAGNOSIS — Z8759 Personal history of other complications of pregnancy, childbirth and the puerperium: Secondary | ICD-10-CM

## 2021-11-23 DIAGNOSIS — O99210 Obesity complicating pregnancy, unspecified trimester: Secondary | ICD-10-CM | POA: Insufficient documentation

## 2021-11-23 DIAGNOSIS — Z3A11 11 weeks gestation of pregnancy: Secondary | ICD-10-CM

## 2021-11-23 DIAGNOSIS — Z3480 Encounter for supervision of other normal pregnancy, unspecified trimester: Secondary | ICD-10-CM | POA: Diagnosis present

## 2021-11-23 DIAGNOSIS — Z3481 Encounter for supervision of other normal pregnancy, first trimester: Secondary | ICD-10-CM

## 2021-11-23 DIAGNOSIS — O99211 Obesity complicating pregnancy, first trimester: Secondary | ICD-10-CM

## 2021-11-23 MED ORDER — ASPIRIN 81 MG PO TBEC: 81.0000 mg | DELAYED_RELEASE_TABLET | Freq: Every day | ORAL | 2 refills | Status: DC

## 2021-11-23 MED ORDER — PROMETHAZINE HCL 25 MG PO TABS: 25.0000 mg | ORAL_TABLET | Freq: Four times a day (QID) | ORAL | 2 refills | Status: DC | PRN

## 2021-11-23 MED ORDER — DOXYLAMINE-PYRIDOXINE 10-10 MG PO TBEC: 2.0000 | DELAYED_RELEASE_TABLET | Freq: Every day | ORAL | 5 refills | Status: DC

## 2021-11-23 NOTE — Progress Notes (Signed)
New OB patient for initial prenatal visit. PAP due, last was 01/27/17. Patient would like genetic testing done today as well. Patient has no questions or concerns at the moment.

## 2021-11-23 NOTE — Patient Instructions (Signed)
First Trimester of Pregnancy  The first trimester of pregnancy starts on the first day of your last menstrual period until the end of week 12. This is months 1 through 3 of pregnancy. A week after a sperm fertilizes an egg, the egg will implant into the wall of the uterus and begin to develop into a baby. By the end of 12 weeks, all the baby's organs will be formed and the baby will be 2-3 inches in size. Body changes during your first trimester Your body goes through many changes during pregnancy. The changes vary and generally return to normal after your baby is born. Physical changes You may gain or lose weight. Your breasts may begin to grow larger and become tender. The tissue that surrounds your nipples (areola) may become darker. Dark spots or blotches (chloasma or mask of pregnancy) may develop on your face. You may have changes in your hair. These can include thickening or thinning of your hair or changes in texture. Health changes You may feel nauseous, and you may vomit. You may have heartburn. You may develop headaches. You may develop constipation. Your gums may bleed and may be sensitive to brushing and flossing. Other changes You may tire easily. You may urinate more often. Your menstrual periods will stop. You may have a loss of appetite. You may develop cravings for certain kinds of food. You may have changes in your emotions from day to day. You may have more vivid and strange dreams. Follow these instructions at home: Medicines Follow your health care provider's instructions regarding medicine use. Specific medicines may be either safe or unsafe to take during pregnancy. Do not take any medicines unless told to by your health care provider. Take a prenatal vitamin that contains at least 600 micrograms (mcg) of folic acid. Eating and drinking Eat a healthy diet that includes fresh fruits and vegetables, whole grains, good sources of protein such as meat, eggs, or tofu,  and low-fat dairy products. Avoid raw meat and unpasteurized juice, milk, and cheese. These carry germs that can harm you and your baby. If you feel nauseous or you vomit: Eat 4 or 5 small meals a day instead of 3 large meals. Try eating a few soda crackers. Drink liquids between meals instead of during meals. You may need to take these actions to prevent or treat constipation: Drink enough fluid to keep your urine pale yellow. Eat foods that are high in fiber, such as beans, whole grains, and fresh fruits and vegetables. Limit foods that are high in fat and processed sugars, such as fried or sweet foods. Activity Exercise only as directed by your health care provider. Most people can continue their usual exercise routine during pregnancy. Try to exercise for 30 minutes at least 5 days a week. Stop exercising if you develop pain or cramping in the lower abdomen or lower back. Avoid exercising if it is very hot or humid or if you are at high altitude. Avoid heavy lifting. If you choose to, you may have sex unless your health care provider tells you not to. Relieving pain and discomfort Wear a good support bra to relieve breast tenderness. Rest with your legs elevated if you have leg cramps or low back pain. If you develop bulging veins (varicose veins) in your legs: Wear support hose as told by your health care provider. Elevate your feet for 15 minutes, 3-4 times a day. Limit salt in your diet. Safety Wear your seat belt at all times when   driving or riding in a car. Talk with your health care provider if someone is verbally or physically abusive to you. Talk with your health care provider if you are feeling sad or have thoughts of hurting yourself. Lifestyle Do not use hot tubs, steam rooms, or saunas. Do not douche. Do not use tampons or scented sanitary pads. Do not use herbal remedies, alcohol, illegal drugs, or medicines that are not approved by your health care provider. Chemicals  in these products can harm your baby. Do not use any products that contain nicotine or tobacco, such as cigarettes, e-cigarettes, and chewing tobacco. If you need help quitting, ask your health care provider. Avoid cat litter boxes and soil used by cats. These carry germs that can cause birth defects in the baby and possibly loss of the unborn baby (fetus) by miscarriage or stillbirth. General instructions During routine prenatal visits in the first trimester, your health care provider will do a physical exam, perform necessary tests, and ask you how things are going. Keep all follow-up visits. This is important. Ask for help if you have counseling or nutritional needs during pregnancy. Your health care provider can offer advice or refer you to specialists for help with various needs. Schedule a dentist appointment. At home, brush your teeth with a soft toothbrush. Floss gently. Write down your questions. Take them to your prenatal visits. Where to find more information American Pregnancy Association: americanpregnancy.[place] and Gynecologists: PoolDevices.com.pt Office on [employer] Health: KeywordPortfolios.com.br Contact a health care provider if you have: Dizziness. A fever. Mild pelvic cramps, pelvic pressure, or nagging pain in the abdominal area. Nausea, vomiting, or diarrhea that lasts for 24 hours or longer. A bad-smelling vaginal discharge. Pain when you urinate. Known exposure to a contagious illness, such as chickenpox, measles, Zika virus, HIV, or hepatitis. Get help right away if you have: Spotting or bleeding from your vagina. Severe abdominal cramping or pain. Shortness of breath or chest pain. Any kind of trauma, such as from a fall or a car crash. New or increased pain, swelling, or redness in an arm or leg. Summary The first trimester of pregnancy starts on the first day of your last menstrual period until the end of week  12 (months 1 through 3). Eating 4 or 5 small meals a day rather than 3 large meals may help to relieve nausea and vomiting. Do not use any products that contain nicotine or tobacco, such as cigarettes, e-cigarettes, and chewing tobacco. If you need help quitting, ask your health care provider. Keep all follow-up visits. This is important. This information is not intended to replace advice given to you by your health care provider. Make sure you discuss any questions you have with your health care provider. Document Revised: 10/02/2019 Document Reviewed: 08/08/2019 Elsevier Patient Education  [place] of Pregnancy  The second trimester of pregnancy is from week 13 through week 27. This is months 4 through 6 of pregnancy. The second trimester is often a time when you feel your best. Your body has adjusted to being pregnant, and you begin to feel better physically. During the second trimester: Morning sickness has lessened or stopped completely. You may have more energy. You may have an increase in appetite. The second trimester is also a time when the unborn baby (fetus) is growing rapidly. At the end of the sixth month, the fetus may be up to 12 inches long and weigh about 1 pounds. You will likely  begin to feel the baby move (quickening) between 16 and 20 weeks of pregnancy. Body changes during your second trimester Your body continues to go through many changes during your second trimester. The changes vary and generally return to normal after the baby is born. Physical changes Your weight will continue to increase. You will notice your lower abdomen bulging out. You may begin to get stretch marks on your hips, abdomen, and breasts. Your breasts will continue to grow and to become tender. Dark spots or blotches (chloasma or mask of pregnancy) may develop on your face. A dark line from your belly button to the pubic area (linea nigra) may appear. You may have  changes in your hair. These can include thickening of your hair, rapid growth, and changes in texture. Some people also have hair loss during or after pregnancy, or hair that feels dry or thin. Health changes You may develop headaches. You may have heartburn. You may develop constipation. You may develop hemorrhoids or swollen, bulging veins (varicose veins). Your gums may bleed and may be sensitive to brushing and flossing. You may urinate more often because the fetus is pressing on your bladder. You may have back pain. This is caused by: Weight gain. Pregnancy hormones that are relaxing the joints in your pelvis. A shift in weight and the muscles that support your balance. Follow these instructions at home: Medicines Follow your health care provider's instructions regarding medicine use. Specific medicines may be either safe or unsafe to take during pregnancy. Do not take any medicines unless approved by your health care provider. Take a prenatal vitamin that contains at least 600 micrograms (mcg) of folic acid. Eating and drinking Eat a healthy diet that includes fresh fruits and vegetables, whole grains, good sources of protein such as meat, eggs, or tofu, and low-fat dairy products. Avoid raw meat and unpasteurized juice, milk, and cheese. These carry germs that can harm you and your baby. You may need to take these actions to prevent or treat constipation: Drink enough fluid to keep your urine pale yellow. Eat foods that are high in fiber, such as beans, whole grains, and fresh fruits and vegetables. Limit foods that are high in fat and processed sugars, such as fried or sweet foods. Activity Exercise only as directed by your health care provider. Most people can continue their usual exercise routine during pregnancy. Try to exercise for 30 minutes at least 5 days a week. Stop exercising if you develop contractions in your uterus. Stop exercising if you develop pain or cramping in the  lower abdomen or lower back. Avoid exercising if it is very hot or humid or if you are at a high altitude. Avoid heavy lifting. If you choose to, you may have sex unless your health care provider tells you not to. Relieving pain and discomfort Wear a supportive bra to prevent discomfort from breast tenderness. Take warm sitz baths to soothe any pain or discomfort caused by hemorrhoids. Use hemorrhoid cream if your health care provider approves. Rest with your legs raised (elevated) if you have leg cramps or low back pain. If you develop varicose veins: Wear support hose as told by your health care provider. Elevate your feet for 15 minutes, 3-4 times a day. Limit salt in your diet. Safety Wear your seat belt at all times when driving or riding in a car. Talk with your health care provider if someone is verbally or physically abusive to you. Lifestyle Do not use hot tubs, steam rooms,  or saunas. Do not douche. Do not use tampons or scented sanitary pads. Avoid cat litter boxes and soil used by cats. These carry germs that can cause birth defects in the baby and possibly loss of the fetus by miscarriage or stillbirth. Do not use herbal remedies, alcohol, illegal drugs, or medicines that are not approved by your health care provider. Chemicals in these products can harm your baby. Do not use any products that contain nicotine or tobacco, such as cigarettes, e-cigarettes, and chewing tobacco. If you need help quitting, ask your health care provider. General instructions During a routine prenatal visit, your health care provider will do a physical exam and other tests. He or she will also discuss your overall health. Keep all follow-up visits. This is important. Ask your health care provider for a referral to a local prenatal education class. Ask for help if you have counseling or nutritional needs during pregnancy. Your health care provider can offer advice or refer you to specialists for help  with various needs. Where to find more information American Pregnancy Association: americanpregnancy.org American College of Obstetricians and Gynecologists: acog.org/en/Womens%20Health/Pregnancy Office on Women's Health: womenshealth.gov/pregnancy Contact a health care provider if you have: A headache that does not go away when you take medicine. Vision changes or you see spots in front of your eyes. Mild pelvic cramps, pelvic pressure, or nagging pain in the abdominal area. Persistent nausea, vomiting, or diarrhea. A bad-smelling vaginal discharge or foul-smelling urine. Pain when you urinate. Sudden or extreme swelling of your face, hands, ankles, feet, or legs. A fever. Get help right away if you: Have fluid leaking from your vagina. Have spotting or bleeding from your vagina. Have severe abdominal cramping or pain. Have difficulty breathing. Have chest pain. Have fainting spells. Have not felt your baby move for the time period told by your health care provider. Have new or increased pain, swelling, or redness in an arm or leg. Summary The second trimester of pregnancy is from week 13 through week 27 (months 4 through 6). Do not use herbal remedies, alcohol, illegal drugs, or medicines that are not approved by your health care provider. Chemicals in these products can harm your baby. Exercise only as directed by your health care provider. Most people can continue their usual exercise routine during pregnancy. Keep all follow-up visits. This is important. This information is not intended to replace advice given to you by your health care provider. Make sure you discuss any questions you have with your health care provider. Document Revised: 10/02/2019 Document Reviewed: 08/08/2019 Elsevier Patient Education  2023 Elsevier Inc.  

## 2021-11-23 NOTE — Progress Notes (Signed)
  Subjective:    Cassandra Avery is a G2P1001 [redacted]w[redacted]d being seen today for her first obstetrical visit.  Her obstetrical history is significant for previous pregnancy with gestational hypertension, maternal obesity. Patient does intend to breast feed. Pregnancy history fully reviewed.  Patient reports nausea.  Vitals:   11/23/21 0952  BP: 112/76  Pulse: 92  Weight: 221 lb 6.4 oz (100.4 kg)    HISTORY: OB History  Gravida Para Term Preterm AB Living  2 1 1  0 0 1  SAB IAB Ectopic Multiple Live Births  0 0 0   1    # Outcome Date GA Lbr Len/2nd Weight Sex Delivery Anes PTL Lv  2 Current           1 Term 01/27/17 [redacted]w[redacted]d 07:42 / 01:59 6 lb 14.2 oz (3.125 kg) F Vag-Spont EPI  LIV   Past Medical History:  Diagnosis Date   Gestational hypertension 01/26/2017   Headache    Recurrent upper respiratory infection (URI)    Past Surgical History:  Procedure Laterality Date   NO PAST SURGERIES     Family History  Problem Relation Age of Onset   Miscarriages / Stillbirths Mother    Stroke Father    Asthma Sister    Stroke Paternal Grandmother    Hearing loss Paternal Grandfather      Exam    Uterus:   12-weeks  Pelvic Exam:    Perineum: No Hemorrhoids, Normal Perineum   Vulva: normal   Vagina:  normal mucosa, normal discharge   pH:    Cervix: multiparous appearance and closed and long   Adnexa: normal adnexa   Bony Pelvis: gynecoid  System: Breast:  normal appearance, no masses or tenderness   Skin: normal coloration and turgor, no rashes    Neurologic: oriented, no focal deficits   Extremities: normal strength, tone, and muscle mass   HEENT extra ocular movement intact   Mouth/Teeth mucous membranes moist, pharynx normal without lesions and dental hygiene good   Neck supple and no masses   Cardiovascular: regular rate and rhythm   Respiratory:  appears well, vitals normal, no respiratory distress, acyanotic, normal RR, chest clear, no wheezing, crepitations, rhonchi, normal  symmetric air entry   Abdomen: soft, non-tender; bowel sounds normal; no masses,  no organomegaly   Urinary:       Assessment:    Pregnancy: G2P1001 Patient Active Problem List   Diagnosis Date Noted   History of gestational hypertension 11/23/2021   Maternal obesity affecting pregnancy, antepartum 11/23/2021   Supervision of other normal pregnancy, antepartum 11/02/2021        Plan:     Initial labs drawn. Prenatal vitamins. Problem list reviewed and updated. Genetic Screening discussed : Panorama ordered.  Ultrasound discussed; fetal survey: ordered. Rx Diclegis and phenergan provided for nausea Rx ASA provided  A1C ordered due to maternal obesity  Follow up in 4 weeks. 50% of 30 min visit spent on counseling and coordination of care.     Al Bracewell 11/23/2021

## 2021-11-24 ENCOUNTER — Encounter: Payer: Self-pay | Admitting: Obstetrics and Gynecology

## 2021-11-24 LAB — CERVICOVAGINAL ANCILLARY ONLY
Bacterial Vaginitis (gardnerella): NEGATIVE
Candida Glabrata: NEGATIVE
Candida Vaginitis: POSITIVE — AB
Chlamydia: NEGATIVE
Comment: NEGATIVE
Comment: NEGATIVE
Comment: NEGATIVE
Comment: NEGATIVE
Comment: NEGATIVE
Comment: NORMAL
Neisseria Gonorrhea: NEGATIVE
Trichomonas: NEGATIVE

## 2021-11-24 LAB — CBC/D/PLT+RPR+RH+ABO+RUBIGG...
Antibody Screen: NEGATIVE
Basophils Absolute: 0 10*3/uL (ref 0.0–0.2)
Basos: 0 %
EOS (ABSOLUTE): 0.1 10*3/uL (ref 0.0–0.4)
Eos: 2 %
HCV Ab: NONREACTIVE
HIV Screen 4th Generation wRfx: NONREACTIVE
Hematocrit: 34.8 % (ref 34.0–46.6)
Hemoglobin: 11.4 g/dL (ref 11.1–15.9)
Hepatitis B Surface Ag: NEGATIVE
Immature Grans (Abs): 0 10*3/uL (ref 0.0–0.1)
Immature Granulocytes: 0 %
Lymphocytes Absolute: 1.6 10*3/uL (ref 0.7–3.1)
Lymphs: 20 %
MCH: 28.9 pg (ref 26.6–33.0)
MCHC: 32.8 g/dL (ref 31.5–35.7)
MCV: 88 fL (ref 79–97)
Monocytes Absolute: 0.6 10*3/uL (ref 0.1–0.9)
Monocytes: 7 %
Neutrophils Absolute: 5.9 10*3/uL (ref 1.4–7.0)
Neutrophils: 71 %
Platelets: 271 10*3/uL (ref 150–450)
RBC: 3.95 x10E6/uL (ref 3.77–5.28)
RDW: 12.9 % (ref 11.7–15.4)
RPR Ser Ql: NONREACTIVE
Rh Factor: POSITIVE
Rubella Antibodies, IGG: 5.87 index (ref >0.99)
WBC: 8.2 10*3/uL (ref 3.4–10.8)

## 2021-11-24 LAB — HCV INTERPRETATION

## 2021-11-24 LAB — HEMOGLOBIN A1C
Est. average glucose Bld gHb Est-mCnc: 114 mg/dL
Hgb A1c MFr Bld: 5.6 % (ref 4.8–5.6)

## 2021-11-25 ENCOUNTER — Other Ambulatory Visit: Payer: Self-pay | Admitting: Emergency Medicine

## 2021-11-25 LAB — CYTOLOGY - PAP
Comment: NEGATIVE
Diagnosis: NEGATIVE
High risk HPV: NEGATIVE

## 2021-11-25 LAB — CULTURE, OB URINE

## 2021-11-25 LAB — URINE CULTURE, OB REFLEX

## 2021-11-25 MED ORDER — TERCONAZOLE 0.8 % VA CREA: 1.0000 | TOPICAL_CREAM | Freq: Every day | VAGINAL | 0 refills | Status: DC

## 2021-11-25 NOTE — Progress Notes (Signed)
Rx sent for yeast.

## 2021-11-29 LAB — PANORAMA PRENATAL TEST FULL PANEL:PANORAMA TEST PLUS 5 ADDITIONAL MICRODELETIONS: FETAL FRACTION: 6.6

## 2021-12-07 ENCOUNTER — Encounter: Payer: Self-pay | Admitting: Obstetrics and Gynecology

## 2021-12-09 ENCOUNTER — Telehealth: Payer: Self-pay

## 2021-12-09 NOTE — Telephone Encounter (Signed)
S/w pt about genetic counseling and she stated that she spoke with Avelina Laine and declined testing for FOB.

## 2021-12-27 ENCOUNTER — Ambulatory Visit (INDEPENDENT_AMBULATORY_CARE_PROVIDER_SITE_OTHER): Payer: 59 | Admitting: Obstetrics and Gynecology

## 2021-12-27 ENCOUNTER — Encounter: Payer: Self-pay | Admitting: Obstetrics and Gynecology

## 2021-12-27 VITALS — BP 119/78 | HR 97 | Wt 226.0 lb

## 2021-12-27 DIAGNOSIS — Z3480 Encounter for supervision of other normal pregnancy, unspecified trimester: Secondary | ICD-10-CM

## 2021-12-27 DIAGNOSIS — Z8759 Personal history of other complications of pregnancy, childbirth and the puerperium: Secondary | ICD-10-CM

## 2021-12-27 DIAGNOSIS — Z3A16 16 weeks gestation of pregnancy: Secondary | ICD-10-CM

## 2021-12-27 DIAGNOSIS — O99210 Obesity complicating pregnancy, unspecified trimester: Secondary | ICD-10-CM

## 2021-12-27 NOTE — Progress Notes (Signed)
   PRENATAL VISIT NOTE  Subjective:  Cassandra Avery is a 36 y.o. G2P1001 at [redacted]w[redacted]d being seen today for ongoing prenatal care.  She is currently monitored for the following issues for this high-risk pregnancy and has Supervision of other normal pregnancy, antepartum; History of gestational hypertension; and Maternal obesity affecting pregnancy, antepartum on their problem list.  Patient reports headache.  Contractions: Not present. Vag. Bleeding: None.  Movement: Present. Denies leaking of fluid.   The following portions of the patient's history were reviewed and updated as appropriate: allergies, current medications, past family history, past medical history, past social history, past surgical history and problem list.   Objective:   Vitals:   12/27/21 1121  BP: 119/78  Pulse: 97  Weight: 226 lb (102.5 kg)    Fetal Status: Fetal Heart Rate (bpm): 155   Movement: Present     General:  Alert, oriented and cooperative. Patient is in no acute distress.  Skin: Skin is warm and dry. No rash noted.   Cardiovascular: Normal heart rate noted  Respiratory: Normal respiratory effort, no problems with respiration noted  Abdomen: Soft, gravid, appropriate for gestational age.  Pain/Pressure: Absent     Pelvic: Cervical exam deferred        Extremities: Normal range of motion.     Mental Status: Normal mood and affect. Normal behavior. Normal judgment and thought content.   Assessment and Plan:  Pregnancy: G2P1001 at [redacted]w[redacted]d 1. History of gestational hypertension Normal BP Continue ASA  2. Supervision of other normal pregnancy, antepartum Patient is doing well Encouraged increase in hydration and to take tylenol AFP today Anatomy ultrasound scheduled - AFP, Serum, Open Spina Bifida  3. Maternal obesity affecting pregnancy, antepartum   Preterm labor symptoms and general obstetric precautions including but not limited to vaginal bleeding, contractions, leaking of fluid and fetal movement  were reviewed in detail with the patient. Please refer to After Visit Summary for other counseling recommendations.   No follow-ups on file.  Future Appointments  Date Time Provider Department Center  01/17/2022  9:30 AM [facility] NURSE [place] [facility]  01/17/2022  9:45 AM [facility] US4 [facility]    Catalina Antigua, MD

## 2021-12-27 NOTE — Progress Notes (Signed)
Pt complains of increase in HA's.

## 2021-12-29 LAB — AFP, SERUM, OPEN SPINA BIFIDA
AFP MoM: 1.22
AFP Value: 37.1 ng/mL
Gest. Age on Collection Date: 16.2 weeks
Maternal Age At EDD: 34.1 yr
OSBR Risk 1 IN: 10000
Test Results:: NEGATIVE
Weight: 226 [lb_av]

## 2022-01-17 ENCOUNTER — Ambulatory Visit: Payer: 59 | Admitting: *Deleted

## 2022-01-17 ENCOUNTER — Encounter: Payer: Self-pay | Admitting: *Deleted

## 2022-01-17 ENCOUNTER — Other Ambulatory Visit: Payer: Self-pay | Admitting: *Deleted

## 2022-01-17 ENCOUNTER — Ambulatory Visit: Payer: 59 | Attending: Obstetrics and Gynecology

## 2022-01-17 VITALS — BP 115/70 | HR 92

## 2022-01-17 DIAGNOSIS — O09292 Supervision of pregnancy with other poor reproductive or obstetric history, second trimester: Secondary | ICD-10-CM | POA: Insufficient documentation

## 2022-01-17 DIAGNOSIS — Z3480 Encounter for supervision of other normal pregnancy, unspecified trimester: Secondary | ICD-10-CM | POA: Insufficient documentation

## 2022-01-17 DIAGNOSIS — Z8759 Personal history of other complications of pregnancy, childbirth and the puerperium: Secondary | ICD-10-CM | POA: Insufficient documentation

## 2022-01-17 DIAGNOSIS — Z3A19 19 weeks gestation of pregnancy: Secondary | ICD-10-CM | POA: Diagnosis not present

## 2022-01-17 DIAGNOSIS — O99212 Obesity complicating pregnancy, second trimester: Secondary | ICD-10-CM | POA: Diagnosis present

## 2022-01-17 DIAGNOSIS — Z362 Encounter for other antenatal screening follow-up: Secondary | ICD-10-CM

## 2022-01-17 DIAGNOSIS — O99210 Obesity complicating pregnancy, unspecified trimester: Secondary | ICD-10-CM | POA: Insufficient documentation

## 2022-01-24 ENCOUNTER — Ambulatory Visit (INDEPENDENT_AMBULATORY_CARE_PROVIDER_SITE_OTHER): Payer: 59 | Admitting: Obstetrics and Gynecology

## 2022-01-24 ENCOUNTER — Encounter: Payer: Self-pay | Admitting: Obstetrics and Gynecology

## 2022-01-24 VITALS — BP 121/75 | HR 93 | Wt 223.8 lb

## 2022-01-24 DIAGNOSIS — Z3A20 20 weeks gestation of pregnancy: Secondary | ICD-10-CM

## 2022-01-24 DIAGNOSIS — Z3480 Encounter for supervision of other normal pregnancy, unspecified trimester: Secondary | ICD-10-CM

## 2022-01-24 DIAGNOSIS — O99212 Obesity complicating pregnancy, second trimester: Secondary | ICD-10-CM

## 2022-01-24 DIAGNOSIS — Z8759 Personal history of other complications of pregnancy, childbirth and the puerperium: Secondary | ICD-10-CM

## 2022-01-24 DIAGNOSIS — Z3482 Encounter for supervision of other normal pregnancy, second trimester: Secondary | ICD-10-CM

## 2022-01-24 DIAGNOSIS — O99210 Obesity complicating pregnancy, unspecified trimester: Secondary | ICD-10-CM

## 2022-01-24 NOTE — Progress Notes (Signed)
Pt presents for ROB visit. No questions or concerns at this time.

## 2022-01-24 NOTE — Progress Notes (Signed)
   PRENATAL VISIT NOTE  Subjective:  Cassandra Avery is a 36 y.o. G2P1001 at [redacted]w[redacted]d being seen today for ongoing prenatal care.  She is currently monitored for the following issues for this high-risk pregnancy and has Supervision of other normal pregnancy, antepartum; History of gestational hypertension; and Maternal obesity affecting pregnancy, antepartum on their problem list.  Patient reports no complaints.  Contractions: Not present. Vag. Bleeding: None.  Movement: Present. Denies leaking of fluid.   The following portions of the patient's history were reviewed and updated as appropriate: allergies, current medications, past family history, past medical history, past social history, past surgical history and problem list.   Objective:   Vitals:   01/24/22 1110  BP: 121/75  Pulse: 93  Weight: 223 lb 12.8 oz (101.5 kg)    Fetal Status: Fetal Heart Rate (bpm): 160 Fundal Height: 21 cm Movement: Present     General:  Alert, oriented and cooperative. Patient is in no acute distress.  Skin: Skin is warm and dry. No rash noted.   Cardiovascular: Normal heart rate noted  Respiratory: Normal respiratory effort, no problems with respiration noted  Abdomen: Soft, gravid, appropriate for gestational age.  Pain/Pressure: Absent     Pelvic: Cervical exam deferred        Extremities: Normal range of motion.  Edema: None  Mental Status: Normal mood and affect. Normal behavior. Normal judgment and thought content.   Assessment and Plan:  Pregnancy: G2P1001 at [redacted]w[redacted]d 1. Supervision of other normal pregnancy, antepartum Patient is doing well without complaints   2. History of gestational hypertension Normotensive  Continue ASA  3. Maternal obesity affecting pregnancy, antepartum Follow up anatomy and growth ultrasound scheduled  Preterm labor symptoms and general obstetric precautions including but not limited to vaginal bleeding, contractions, leaking of fluid and fetal movement were reviewed  in detail with the patient. Please refer to After Visit Summary for other counseling recommendations.   Return in about 4 weeks (around 02/21/2022) for in person, ROB, High risk.  Future Appointments  Date Time Provider [place]  02/14/2022  8:30 AM [facility] NURSE [facility]  02/14/2022  8:45 AM [facility] US4 [facility] [place]    Mora Bellman, MD

## 2022-01-25 ENCOUNTER — Encounter: Payer: Self-pay | Admitting: Obstetrics and Gynecology

## 2022-02-02 LAB — HORIZON CUSTOM: REPORT SUMMARY: POSITIVE — AB

## 2022-02-14 ENCOUNTER — Ambulatory Visit: Payer: 59 | Admitting: *Deleted

## 2022-02-14 ENCOUNTER — Other Ambulatory Visit: Payer: Self-pay | Admitting: *Deleted

## 2022-02-14 ENCOUNTER — Ambulatory Visit: Payer: 59 | Attending: Obstetrics

## 2022-02-14 ENCOUNTER — Encounter: Payer: Self-pay | Admitting: *Deleted

## 2022-02-14 VITALS — BP 124/68 | HR 90

## 2022-02-14 DIAGNOSIS — O99212 Obesity complicating pregnancy, second trimester: Secondary | ICD-10-CM

## 2022-02-14 DIAGNOSIS — Z3480 Encounter for supervision of other normal pregnancy, unspecified trimester: Secondary | ICD-10-CM

## 2022-02-14 DIAGNOSIS — E669 Obesity, unspecified: Secondary | ICD-10-CM | POA: Diagnosis not present

## 2022-02-14 DIAGNOSIS — Z8759 Personal history of other complications of pregnancy, childbirth and the puerperium: Secondary | ICD-10-CM | POA: Insufficient documentation

## 2022-02-14 DIAGNOSIS — O99213 Obesity complicating pregnancy, third trimester: Secondary | ICD-10-CM

## 2022-02-14 DIAGNOSIS — Z148 Genetic carrier of other disease: Secondary | ICD-10-CM

## 2022-02-14 DIAGNOSIS — O10912 Unspecified pre-existing hypertension complicating pregnancy, second trimester: Secondary | ICD-10-CM | POA: Diagnosis not present

## 2022-02-14 DIAGNOSIS — Z3A23 23 weeks gestation of pregnancy: Secondary | ICD-10-CM | POA: Insufficient documentation

## 2022-02-14 DIAGNOSIS — Z362 Encounter for other antenatal screening follow-up: Secondary | ICD-10-CM | POA: Diagnosis present

## 2022-02-14 DIAGNOSIS — O09292 Supervision of pregnancy with other poor reproductive or obstetric history, second trimester: Secondary | ICD-10-CM | POA: Insufficient documentation

## 2022-02-14 DIAGNOSIS — Z8679 Personal history of other diseases of the circulatory system: Secondary | ICD-10-CM

## 2022-02-22 ENCOUNTER — Ambulatory Visit (INDEPENDENT_AMBULATORY_CARE_PROVIDER_SITE_OTHER): Payer: 59 | Admitting: Obstetrics and Gynecology

## 2022-02-22 ENCOUNTER — Encounter: Payer: Self-pay | Admitting: Obstetrics and Gynecology

## 2022-02-22 VITALS — BP 114/71 | HR 98 | Wt 226.0 lb

## 2022-02-22 DIAGNOSIS — Z8759 Personal history of other complications of pregnancy, childbirth and the puerperium: Secondary | ICD-10-CM

## 2022-02-22 DIAGNOSIS — O99210 Obesity complicating pregnancy, unspecified trimester: Secondary | ICD-10-CM

## 2022-02-22 DIAGNOSIS — O99212 Obesity complicating pregnancy, second trimester: Secondary | ICD-10-CM

## 2022-02-22 DIAGNOSIS — Z3480 Encounter for supervision of other normal pregnancy, unspecified trimester: Secondary | ICD-10-CM

## 2022-02-22 DIAGNOSIS — Z3A24 24 weeks gestation of pregnancy: Secondary | ICD-10-CM

## 2022-02-22 NOTE — Progress Notes (Signed)
   PRENATAL VISIT NOTE  Subjective:  Cassandra Avery is a 36 y.o. G2P1001 at [redacted]w[redacted]d being seen today for ongoing prenatal care.  She is currently monitored for the following issues for this high-risk pregnancy and has Supervision of other normal pregnancy, antepartum; History of gestational hypertension; and Maternal obesity affecting pregnancy, antepartum on their problem list.  Patient reports no complaints.  Contractions: Not present. Vag. Bleeding: None.  Movement: Present. Denies leaking of fluid.   The following portions of the patient's history were reviewed and updated as appropriate: allergies, current medications, past family history, past medical history, past social history, past surgical history and problem list.   Objective:   Vitals:   02/22/22 1004  BP: 114/71  Pulse: 98  Weight: 226 lb (102.5 kg)    Fetal Status: Fetal Heart Rate (bpm): 145 Fundal Height: 24 cm Movement: Present     General:  Alert, oriented and cooperative. Patient is in no acute distress.  Skin: Skin is warm and dry. No rash noted.   Cardiovascular: Normal heart rate noted  Respiratory: Normal respiratory effort, no problems with respiration noted  Abdomen: Soft, gravid, appropriate for gestational age.  Pain/Pressure: Absent     Pelvic: Cervical exam deferred        Extremities: Normal range of motion.     Mental Status: Normal mood and affect. Normal behavior. Normal judgment and thought content.   Assessment and Plan:  Pregnancy: G2P1001 at [redacted]w[redacted]d 1. Supervision of other normal pregnancy, antepartum Patient is doing well without complaints Third trimester labs and glucola next visit  2. Obesity affecting pregnancy, antepartum, unspecified obesity type Continue ASA Growth ultrasound scheduled  3. History of gestational hypertension Normotensive without medication  Preterm labor symptoms and general obstetric precautions including but not limited to vaginal bleeding, contractions, leaking of  fluid and fetal movement were reviewed in detail with the patient. Please refer to After Visit Summary for other counseling recommendations.   Return in about 4 weeks (around 03/22/2022) for in person, ROB, Low risk, 2 hr glucola next visit.  Future Appointments  Date Time Provider [place]  03/21/2022  9:15 AM [facility] NURSE [facility]  03/21/2022  9:30 AM [facility] US3 [facility] [place]    Mora Bellman, MD

## 2022-03-21 ENCOUNTER — Ambulatory Visit: Payer: 59 | Attending: Obstetrics

## 2022-03-21 ENCOUNTER — Ambulatory Visit: Payer: 59 | Admitting: *Deleted

## 2022-03-21 ENCOUNTER — Other Ambulatory Visit: Payer: Self-pay | Admitting: *Deleted

## 2022-03-21 VITALS — BP 119/64 | HR 97

## 2022-03-21 DIAGNOSIS — Z3A28 28 weeks gestation of pregnancy: Secondary | ICD-10-CM | POA: Insufficient documentation

## 2022-03-21 DIAGNOSIS — O10913 Unspecified pre-existing hypertension complicating pregnancy, third trimester: Secondary | ICD-10-CM | POA: Insufficient documentation

## 2022-03-21 DIAGNOSIS — O99213 Obesity complicating pregnancy, third trimester: Secondary | ICD-10-CM | POA: Insufficient documentation

## 2022-03-21 DIAGNOSIS — E669 Obesity, unspecified: Secondary | ICD-10-CM

## 2022-03-21 DIAGNOSIS — Z3480 Encounter for supervision of other normal pregnancy, unspecified trimester: Secondary | ICD-10-CM

## 2022-03-21 DIAGNOSIS — Z8759 Personal history of other complications of pregnancy, childbirth and the puerperium: Secondary | ICD-10-CM | POA: Insufficient documentation

## 2022-03-21 DIAGNOSIS — O09293 Supervision of pregnancy with other poor reproductive or obstetric history, third trimester: Secondary | ICD-10-CM | POA: Insufficient documentation

## 2022-03-21 DIAGNOSIS — O99210 Obesity complicating pregnancy, unspecified trimester: Secondary | ICD-10-CM

## 2022-03-21 DIAGNOSIS — Z148 Genetic carrier of other disease: Secondary | ICD-10-CM | POA: Diagnosis present

## 2022-03-21 DIAGNOSIS — Z8679 Personal history of other diseases of the circulatory system: Secondary | ICD-10-CM | POA: Insufficient documentation

## 2022-03-23 ENCOUNTER — Ambulatory Visit (INDEPENDENT_AMBULATORY_CARE_PROVIDER_SITE_OTHER): Payer: 59 | Admitting: Advanced Practice Midwife

## 2022-03-23 ENCOUNTER — Other Ambulatory Visit: Payer: 59

## 2022-03-23 VITALS — BP 115/75 | HR 91 | Wt 225.4 lb

## 2022-03-23 DIAGNOSIS — O99213 Obesity complicating pregnancy, third trimester: Secondary | ICD-10-CM

## 2022-03-23 DIAGNOSIS — O99013 Anemia complicating pregnancy, third trimester: Secondary | ICD-10-CM

## 2022-03-23 DIAGNOSIS — O99210 Obesity complicating pregnancy, unspecified trimester: Secondary | ICD-10-CM

## 2022-03-23 DIAGNOSIS — Z8759 Personal history of other complications of pregnancy, childbirth and the puerperium: Secondary | ICD-10-CM

## 2022-03-23 DIAGNOSIS — Z3483 Encounter for supervision of other normal pregnancy, third trimester: Secondary | ICD-10-CM

## 2022-03-23 DIAGNOSIS — Z3480 Encounter for supervision of other normal pregnancy, unspecified trimester: Secondary | ICD-10-CM

## 2022-03-23 DIAGNOSIS — Z3A28 28 weeks gestation of pregnancy: Secondary | ICD-10-CM

## 2022-03-23 NOTE — Patient Instructions (Signed)

## 2022-03-23 NOTE — Progress Notes (Signed)
   PRENATAL VISIT NOTE  Subjective:  Cassandra Avery is a 36 y.o. G2P1001 at [redacted]w[redacted]d being seen today for ongoing prenatal care.  She is currently monitored for the following issues for this low-risk pregnancy and has Supervision of other normal pregnancy, antepartum; History of gestational hypertension; and Maternal obesity affecting pregnancy, antepartum on their problem list.  Patient reports no complaints.  Contractions: Not present. Vag. Bleeding: None.  Movement: Present. Denies leaking of fluid.   The following portions of the patient's history were reviewed and updated as appropriate: allergies, current medications, past family history, past medical history, past social history, past surgical history and problem list.   Objective:   Vitals:   03/23/22 1003  BP: 115/75  Pulse: 91  Weight: 225 lb 6.4 oz (102.2 kg)    Fetal Status: Fetal Heart Rate (bpm): 145   Movement: Present     General:  Alert, oriented and cooperative. Patient is in no acute distress.  Skin: Skin is warm and dry. No rash noted.   Cardiovascular: Normal heart rate noted  Respiratory: Normal respiratory effort, no problems with respiration noted  Abdomen: Soft, gravid, appropriate for gestational age.  Pain/Pressure: Absent     Pelvic: Cervical exam deferred        Extremities: Normal range of motion.  Edema: None  Mental Status: Normal mood and affect. Normal behavior. Normal judgment and thought content.   Assessment and Plan:  Pregnancy: G2P1001 at [redacted]w[redacted]d 1. Supervision of other normal pregnancy, antepartum --Anticipatory guidance about next visits/weeks of pregnancy given.   - Glucose Tolerance, 2 Hours w/1 Hour - CBC - HIV Antibody (routine testing w rflx) - RPR  2. [redacted] weeks gestation of pregnancy  3. Obesity affecting pregnancy, antepartum, unspecified obesity type --Antenatal testing for BMI >40 --FH ahead today at 32 cm, EFW by [place] 63% on 11/13  4. History of gestational hypertension --BP wnl  today --Taking BASA   Preterm labor symptoms and general obstetric precautions including but not limited to vaginal bleeding, contractions, leaking of fluid and fetal movement were reviewed in detail with the patient. Please refer to After Visit Summary for other counseling recommendations.   No follow-ups on file.  Future Appointments  Date Time Provider Department Center  04/13/2022  9:55 AM Hermina Staggers, MD [facility] None  05/04/2022  9:15 AM [facility] NURSE [facility]  05/04/2022  9:30 AM [facility] US2 [facility]  05/11/2022  9:15 AM [facility]URSE [facility]  05/11/2022  9:30 AM [facility] US3 [facility]  05/18/2022 10:45 AM [facility] NURSE [facility]  05/18/2022 11:00 AM [facility] US1 [facility]    Sharen Counter, CNM

## 2022-03-23 NOTE — Progress Notes (Signed)
Patient presents for ROB and GTT. Patient has no concerns today. Declined flu and TDAP.

## 2022-03-24 LAB — CBC
Hematocrit: 30.1 % — ABNORMAL LOW (ref 34.0–46.6)
Hemoglobin: 10 g/dL — ABNORMAL LOW (ref 11.1–15.9)
MCH: 28.9 pg (ref 26.6–33.0)
MCHC: 33.2 g/dL (ref 31.5–35.7)
MCV: 87 fL (ref 79–97)
Platelets: 241 10*3/uL (ref 150–450)
RBC: 3.46 x10E6/uL — ABNORMAL LOW (ref 3.77–5.28)
RDW: 12.7 % (ref 11.7–15.4)
WBC: 7.4 10*3/uL (ref 3.4–10.8)

## 2022-03-24 LAB — GLUCOSE TOLERANCE, 2 HOURS W/ 1HR
Glucose, 1 hour: 126 mg/dL (ref 70–179)
Glucose, 2 hour: 113 mg/dL (ref 70–152)
Glucose, Fasting: 76 mg/dL (ref 70–91)

## 2022-03-24 LAB — RPR: RPR Ser Ql: NONREACTIVE

## 2022-03-24 LAB — HIV ANTIBODY (ROUTINE TESTING W REFLEX): HIV Screen 4th Generation wRfx: NONREACTIVE

## 2022-03-26 MED ORDER — FERROUS SULFATE 325 (65 FE) MG PO TABS: 325.0000 mg | ORAL_TABLET | ORAL | 5 refills | Status: DC

## 2022-03-26 NOTE — Addendum Note (Signed)
Addended by: Sharen Counter A on: 03/26/2022 01:01 PM   Modules accepted: Orders

## 2022-04-13 ENCOUNTER — Ambulatory Visit (INDEPENDENT_AMBULATORY_CARE_PROVIDER_SITE_OTHER): Payer: 59 | Admitting: Obstetrics and Gynecology

## 2022-04-13 ENCOUNTER — Encounter: Payer: Self-pay | Admitting: Obstetrics and Gynecology

## 2022-04-13 VITALS — BP 99/67 | HR 99 | Wt 226.0 lb

## 2022-04-13 DIAGNOSIS — Z3483 Encounter for supervision of other normal pregnancy, third trimester: Secondary | ICD-10-CM

## 2022-04-13 DIAGNOSIS — O99210 Obesity complicating pregnancy, unspecified trimester: Secondary | ICD-10-CM

## 2022-04-13 DIAGNOSIS — O99213 Obesity complicating pregnancy, third trimester: Secondary | ICD-10-CM

## 2022-04-13 DIAGNOSIS — Z3A31 31 weeks gestation of pregnancy: Secondary | ICD-10-CM

## 2022-04-13 DIAGNOSIS — Z3480 Encounter for supervision of other normal pregnancy, unspecified trimester: Secondary | ICD-10-CM

## 2022-04-13 DIAGNOSIS — Z8759 Personal history of other complications of pregnancy, childbirth and the puerperium: Secondary | ICD-10-CM

## 2022-04-13 NOTE — Progress Notes (Signed)
Subjective:  Cassandra Avery is a 36 y.o. G2P1001 at [redacted]w[redacted]d being seen today for ongoing prenatal care.  She is currently monitored for the following issues for this low-risk pregnancy and has Supervision of other normal pregnancy, antepartum; History of gestational hypertension; and Maternal obesity affecting pregnancy, antepartum on their problem list.  Patient reports  general discomforts of pregnancy .  Contractions: Not present. Vag. Bleeding: None.  Movement: Present. Denies leaking of fluid.   The following portions of the patient's history were reviewed and updated as appropriate: allergies, current medications, past family history, past medical history, past social history, past surgical history and problem list. Problem list updated.  Objective:   Vitals:   04/13/22 1007  BP: 99/67  Pulse: 99  Weight: 226 lb (102.5 kg)    Fetal Status: Fetal Heart Rate (bpm): 146   Movement: Present     General:  Alert, oriented and cooperative. Patient is in no acute distress.  Skin: Skin is warm and dry. No rash noted.   Cardiovascular: Normal heart rate noted  Respiratory: Normal respiratory effort, no problems with respiration noted  Abdomen: Soft, gravid, appropriate for gestational age. Pain/Pressure: Present     Pelvic:  Cervical exam deferred        Extremities: Normal range of motion.  Edema: Trace  Mental Status: Normal mood and affect. Normal behavior. Normal judgment and thought content.   Urinalysis:      Assessment and Plan:  Pregnancy: G2P1001 at 110w4d  1. Supervision of other normal pregnancy, antepartum Stable  2. History of gestational hypertension BP stable   3. Obesity affecting pregnancy, antepartum, unspecified obesity type Serial growth scans and antenatal testing as per protocol  Preterm labor symptoms and general obstetric precautions including but not limited to vaginal bleeding, contractions, leaking of fluid and fetal movement were reviewed in detail with the  patient. Please refer to After Visit Summary for other counseling recommendations.  Return in about 2 weeks (around 04/27/2022) for OB visit, face to face, any provider.   Hermina Staggers, MD

## 2022-04-13 NOTE — Patient Instructions (Signed)

## 2022-04-27 ENCOUNTER — Ambulatory Visit (INDEPENDENT_AMBULATORY_CARE_PROVIDER_SITE_OTHER): Payer: 59 | Admitting: Obstetrics & Gynecology

## 2022-04-27 VITALS — BP 116/78 | HR 105 | Wt 229.0 lb

## 2022-04-27 DIAGNOSIS — Z3A33 33 weeks gestation of pregnancy: Secondary | ICD-10-CM

## 2022-04-27 DIAGNOSIS — O99210 Obesity complicating pregnancy, unspecified trimester: Secondary | ICD-10-CM

## 2022-04-27 DIAGNOSIS — Z8759 Personal history of other complications of pregnancy, childbirth and the puerperium: Secondary | ICD-10-CM

## 2022-04-27 DIAGNOSIS — O99213 Obesity complicating pregnancy, third trimester: Secondary | ICD-10-CM

## 2022-04-27 DIAGNOSIS — Z3480 Encounter for supervision of other normal pregnancy, unspecified trimester: Secondary | ICD-10-CM

## 2022-04-27 DIAGNOSIS — Z3483 Encounter for supervision of other normal pregnancy, third trimester: Secondary | ICD-10-CM

## 2022-04-27 NOTE — Progress Notes (Signed)
   PRENATAL VISIT NOTE  Subjective:  Cassandra Avery is a 36 y.o. G2P1001 at [redacted]w[redacted]d being seen today for ongoing prenatal care.  She is currently monitored for the following issues for this high-risk pregnancy and has Supervision of other normal pregnancy, antepartum; History of gestational hypertension; and Maternal obesity affecting pregnancy, antepartum on their problem list.  Patient reports no complaints.  Contractions: Irritability. Vag. Bleeding: None.  Movement: Present. Denies leaking of fluid.   The following portions of the patient's history were reviewed and updated as appropriate: allergies, current medications, past family history, past medical history, past social history, past surgical history and problem list.   Objective:   Vitals:   04/27/22 1502  BP: 116/78  Pulse: (!) 105  Weight: 229 lb (103.9 kg)    Fetal Status: Fetal Heart Rate (bpm): 144   Movement: Present     General:  Alert, oriented and cooperative. Patient is in no acute distress.  Skin: Skin is warm and dry. No rash noted.   Cardiovascular: Normal heart rate noted  Respiratory: Normal respiratory effort, no problems with respiration noted  Abdomen: Soft, gravid, appropriate for gestational age.  Pain/Pressure: Present     Pelvic: Cervical exam deferred        Extremities: Normal range of motion.  Edema: Trace  Mental Status: Normal mood and affect. Normal behavior. Normal judgment and thought content.   Assessment and Plan:  Pregnancy: G2P1001 at [redacted]w[redacted]d 1. History of gestational hypertension Nl BP  2. Supervision of other normal pregnancy, antepartum F/u US next week  3. Obesity affecting pregnancy, antepartum, unspecified obesity type Weekly surveillance  Preterm labor symptoms and general obstetric precautions including but not limited to vaginal bleeding, contractions, leaking of fluid and fetal movement were reviewed in detail with the patient. Please refer to After Visit Summary for other  counseling recommendations.   Return in about 2 weeks (around 05/11/2022).  Future Appointments  Date Time Provider Department Center  05/04/2022  9:15 AM [facility] NURSE [facility]  05/04/2022  9:30 AM [facility] US2 [facility]  05/11/2022  9:15 AM [facility] NURSE [facility]  05/11/2022  9:30 AM [facility] US3 [facility]  05/18/2022 10:45 AM [facility] NURSE [facility]  05/18/2022 11:00 AM [facility] US1 [facility]    Scheryl Darter, MD

## 2022-05-04 ENCOUNTER — Other Ambulatory Visit: Payer: Self-pay | Admitting: *Deleted

## 2022-05-04 ENCOUNTER — Ambulatory Visit: Payer: Managed Care, Other (non HMO) | Attending: Obstetrics

## 2022-05-04 ENCOUNTER — Ambulatory Visit: Payer: Managed Care, Other (non HMO) | Admitting: *Deleted

## 2022-05-04 VITALS — BP 119/64 | HR 95

## 2022-05-04 DIAGNOSIS — O99213 Obesity complicating pregnancy, third trimester: Secondary | ICD-10-CM | POA: Insufficient documentation

## 2022-05-04 DIAGNOSIS — E669 Obesity, unspecified: Secondary | ICD-10-CM

## 2022-05-04 DIAGNOSIS — Z8759 Personal history of other complications of pregnancy, childbirth and the puerperium: Secondary | ICD-10-CM | POA: Insufficient documentation

## 2022-05-04 DIAGNOSIS — O280 Abnormal hematological finding on antenatal screening of mother: Secondary | ICD-10-CM

## 2022-05-04 DIAGNOSIS — O09293 Supervision of pregnancy with other poor reproductive or obstetric history, third trimester: Secondary | ICD-10-CM | POA: Diagnosis not present

## 2022-05-04 DIAGNOSIS — Z3A34 34 weeks gestation of pregnancy: Secondary | ICD-10-CM

## 2022-05-10 ENCOUNTER — Inpatient Hospital Stay (HOSPITAL_COMMUNITY)
Admission: AD | Admit: 2022-05-10 | Discharge: 2022-05-10 | Disposition: A | Discharge status: Home / Self Care | Payer: 59 | Attending: Obstetrics and Gynecology | Admitting: Obstetrics and Gynecology

## 2022-05-10 ENCOUNTER — Encounter (HOSPITAL_COMMUNITY): Payer: Self-pay | Admitting: Obstetrics and Gynecology

## 2022-05-10 DIAGNOSIS — Z3A35 35 weeks gestation of pregnancy: Secondary | ICD-10-CM | POA: Diagnosis not present

## 2022-05-10 DIAGNOSIS — M25531 Pain in right wrist: Secondary | ICD-10-CM | POA: Insufficient documentation

## 2022-05-10 DIAGNOSIS — Z3480 Encounter for supervision of other normal pregnancy, unspecified trimester: Secondary | ICD-10-CM

## 2022-05-10 DIAGNOSIS — G5603 Carpal tunnel syndrome, bilateral upper limbs: Secondary | ICD-10-CM

## 2022-05-10 DIAGNOSIS — O26892 Other specified pregnancy related conditions, second trimester: Secondary | ICD-10-CM | POA: Diagnosis not present

## 2022-05-10 DIAGNOSIS — M25532 Pain in left wrist: Secondary | ICD-10-CM | POA: Diagnosis not present

## 2022-05-10 DIAGNOSIS — O09293 Supervision of pregnancy with other poor reproductive or obstetric history, third trimester: Secondary | ICD-10-CM | POA: Diagnosis not present

## 2022-05-10 DIAGNOSIS — O26893 Other specified pregnancy related conditions, third trimester: Secondary | ICD-10-CM | POA: Diagnosis present

## 2022-05-10 DIAGNOSIS — O99210 Obesity complicating pregnancy, unspecified trimester: Secondary | ICD-10-CM

## 2022-05-10 DIAGNOSIS — O479 False labor, unspecified: Secondary | ICD-10-CM

## 2022-05-10 DIAGNOSIS — Z8759 Personal history of other complications of pregnancy, childbirth and the puerperium: Secondary | ICD-10-CM

## 2022-05-10 DIAGNOSIS — M25431 Effusion, right wrist: Secondary | ICD-10-CM

## 2022-05-10 NOTE — MAU Provider Note (Signed)
History     CSN: [id number]  Arrival date and time: 05/10/22 1029   Event Date/Time   First Provider Initiated Contact with Patient 05/10/22 1125      Chief Complaint  Patient presents with   Edema   Numbness   Contractions   HPI Patient is 37 y.o. G2P1001 17w3dhere with complaints of bilateral wrist pain which is the reason for her ER visit today. Patient reports having history of carpal tunnel and had in prior pregnancy. Noted suddenly increased in her wrist pain with tingling and weakness in bilateral hands for the past two days., noted sx in digits 1-3 bilaterally. She reports trouble with driving and any activity that uses her hands. They frequently go numb. She reports shaking her hands frequently because they fall asleep. 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 She is having occasional contractions.   +FM, denies LOF, VB, vaginal discharge.   OB History     Gravida  2   Para  1   Term  1   Preterm  0   AB  0   Living  1      SAB  0   IAB  0   Ectopic  0   Multiple      Live Births  1           Past Medical History:  Diagnosis Date   Gestational hypertension 01/26/2017   Headache    Recurrent upper respiratory infection (URI)     Past Surgical History:  Procedure Laterality Date   NO PAST SURGERIES      Family History  Problem Relation Age of Onset   Miscarriages / Stillbirths Mother    Stroke Father    Asthma Sister    Stroke Paternal Grandmother    Hearing loss Paternal Grandfather     Social History   Tobacco Use   Smoking status: Never   Smokeless tobacco: Never  Vaping Use   Vaping Use: Never used  Substance Use Topics   Alcohol use: Not Currently    Comment: socially, prior to pregnancy   Drug use: No    Allergies: No Known Allergies  Medications Prior to Admission  Medication Sig Dispense Refill Last Dose   aspirin EC 81 MG tablet Take 1 tablet (81 mg total) by mouth daily. Take  after 12 weeks for prevention of preeclampsia later in pregnancy 300 tablet 2 05/09/2022   Blood Pressure Monitoring (BLOOD PRESSURE KIT) DEVI 1 kit by Does not apply route once a week. 1 each 0 05/09/2022   ferrous sulfate (FERROUSUL) 325 (65 FE) MG tablet Take 1 tablet (325 mg total) by mouth every other day. 30 tablet 5 05/09/2022   Prenatal MV-Min-FA-Omega-3 (PRENATAL GUMMIES/DHA & FA) 0.4-32.5 MG CHEW Chew 2 each by mouth at bedtime.   05/09/2022    Review of Systems Physical Exam   Blood pressure 128/66, pulse (!) 107, temperature 98.1 F (36.7 C), temperature source Oral, resp. rate 19, last menstrual period 09/04/2021, SpO2 100 %, currently breastfeeding.  Physical Exam Vitals and nursing note reviewed.  Constitutional:      General: She is not in acute distress.    Appearance: She is well-developed.     Comments: Pregnant female  HENT:     Head: Normocephalic and atraumatic.  Eyes:     General: No scleral icterus.    Conjunctiva/sclera: Conjunctivae normal.  Cardiovascular:     Rate and Rhythm: Normal  rate.  Pulmonary:     Effort: Pulmonary effort is normal.  Chest:     Chest wall: No tenderness.  Abdominal:     Palpations: Abdomen is soft.     Tenderness: There is no abdominal tenderness. There is no guarding or rebound.     Comments: Gravid 35 weeks  Genitourinary:    Vagina: Normal.  Musculoskeletal:     Right wrist: Swelling present. No effusion or bony tenderness. Decreased range of motion.     Left wrist: Swelling present. No effusion or bony tenderness. Decreased range of motion.     Cervical back: Normal range of motion and neck supple.     Comments: + phalens bilaterally. Tingling in digits 1-3 bilaterally. Grip strength 5/5. No wasting of thumb musculature. No neuropathic symptoms with applied axial load on cervical spine.   Skin:    General: Skin is warm and dry.     Findings: No rash.  Neurological:     Mental Status: She is alert and oriented to person,  place, and time.     MAU Course  Procedures  MDM Low  Assessment and Plan   Carpal Tunnel - Isolated caused to distal, no cervical nerve impingement as axial load on cervical spine did not cause sx.  discussed conservative treatment with wrist braces and reviewed correct position of wrist in detail.  - Offered bilateral steroid injections -- Discussed role of steroid injection to help alleviate symptoms as conservative measures have not provided relief. Reviewed risks/benefits of procedure. Reviewed complications of procedure which include but are not limited to bleeding, elevated blood glucose levels, infection, median nerve injury, pain, and paresthesias - Patient opted to try her wrist braces and discuss with her provider at her next appt  EFW: NST was reactive a reassuring Contraction: intermittent, every 8-10  Future Appointments  Date Time Provider [place]  05/11/2022  9:15 AM [facility] NURSE [facility]  05/11/2022  9:30 AM [facility] US3 [facility]  05/17/2022  8:55 AM Griffin Basil, MD [place] None  05/18/2022 10:45 AM [facility] NURSE [facility]  05/18/2022 11:00 AM [facility] US1 [facility]  05/25/2022  2:45 PM [facility] NURSE [facility]  05/25/2022  3:00 PM [facility] US1 [facility]  06/01/2022 10:30 AM [facility] NURSE [facility]  06/01/2022 10:45 AM [facility] US4 [facility] [place] 05/10/2022, 11:29 AM

## 2022-05-10 NOTE — MAU Note (Signed)
.  Cassandra Avery is a 37 y.o. at [redacted]w[redacted]d here in MAU reporting: last week she started having swelling, pain, and intermittent numbness in her hands. She previously had carpal tunnel in her last pregnancy and it feels like that again. Also reports intermittent ctx. Denies VB or LOF. +FM.   Pain score: 7 Vitals:   05/10/22 1042  BP: 120/77  Pulse: (!) 107  Resp: 19  Temp: 98.1 F (36.7 C)  SpO2: 100%     FHT:147 Lab orders placed from triage:  UA

## 2022-05-11 ENCOUNTER — Encounter: Payer: Self-pay | Admitting: *Deleted

## 2022-05-11 ENCOUNTER — Ambulatory Visit: Payer: Managed Care, Other (non HMO) | Attending: Obstetrics

## 2022-05-11 ENCOUNTER — Ambulatory Visit: Payer: Managed Care, Other (non HMO) | Admitting: *Deleted

## 2022-05-11 VITALS — BP 125/75 | HR 94

## 2022-05-11 DIAGNOSIS — Z8759 Personal history of other complications of pregnancy, childbirth and the puerperium: Secondary | ICD-10-CM | POA: Diagnosis present

## 2022-05-11 DIAGNOSIS — Z3A35 35 weeks gestation of pregnancy: Secondary | ICD-10-CM | POA: Diagnosis not present

## 2022-05-11 DIAGNOSIS — O10913 Unspecified pre-existing hypertension complicating pregnancy, third trimester: Secondary | ICD-10-CM | POA: Insufficient documentation

## 2022-05-11 DIAGNOSIS — E669 Obesity, unspecified: Secondary | ICD-10-CM | POA: Diagnosis not present

## 2022-05-11 DIAGNOSIS — Z3480 Encounter for supervision of other normal pregnancy, unspecified trimester: Secondary | ICD-10-CM

## 2022-05-11 DIAGNOSIS — O24113 Pre-existing diabetes mellitus, type 2, in pregnancy, third trimester: Secondary | ICD-10-CM | POA: Insufficient documentation

## 2022-05-11 DIAGNOSIS — O99210 Obesity complicating pregnancy, unspecified trimester: Secondary | ICD-10-CM

## 2022-05-11 DIAGNOSIS — O09293 Supervision of pregnancy with other poor reproductive or obstetric history, third trimester: Secondary | ICD-10-CM | POA: Insufficient documentation

## 2022-05-11 DIAGNOSIS — O99213 Obesity complicating pregnancy, third trimester: Secondary | ICD-10-CM | POA: Diagnosis not present

## 2022-05-17 ENCOUNTER — Other Ambulatory Visit (HOSPITAL_COMMUNITY)
Admission: RE | Admit: 2022-05-17 | Discharge: 2022-05-17 | Disposition: A | Discharge status: Home / Self Care | Payer: Managed Care, Other (non HMO) | Source: Ambulatory Visit | Attending: Obstetrics and Gynecology | Admitting: Obstetrics and Gynecology

## 2022-05-17 ENCOUNTER — Ambulatory Visit (INDEPENDENT_AMBULATORY_CARE_PROVIDER_SITE_OTHER): Payer: Medicaid Other | Admitting: Obstetrics and Gynecology

## 2022-05-17 VITALS — BP 118/78 | HR 98 | Wt 236.8 lb

## 2022-05-17 DIAGNOSIS — Z3480 Encounter for supervision of other normal pregnancy, unspecified trimester: Secondary | ICD-10-CM

## 2022-05-17 DIAGNOSIS — Z8759 Personal history of other complications of pregnancy, childbirth and the puerperium: Secondary | ICD-10-CM

## 2022-05-17 DIAGNOSIS — Z3490 Encounter for supervision of normal pregnancy, unspecified, unspecified trimester: Secondary | ICD-10-CM | POA: Insufficient documentation

## 2022-05-17 DIAGNOSIS — Z6841 Body Mass Index (BMI) 40.0 and over, adult: Secondary | ICD-10-CM | POA: Insufficient documentation

## 2022-05-17 DIAGNOSIS — Z3483 Encounter for supervision of other normal pregnancy, third trimester: Secondary | ICD-10-CM

## 2022-05-17 DIAGNOSIS — Z3A36 36 weeks gestation of pregnancy: Secondary | ICD-10-CM | POA: Insufficient documentation

## 2022-05-17 DIAGNOSIS — O26893 Other specified pregnancy related conditions, third trimester: Secondary | ICD-10-CM | POA: Diagnosis present

## 2022-05-17 DIAGNOSIS — O99212 Obesity complicating pregnancy, second trimester: Secondary | ICD-10-CM

## 2022-05-17 DIAGNOSIS — O99210 Obesity complicating pregnancy, unspecified trimester: Secondary | ICD-10-CM

## 2022-05-17 NOTE — Progress Notes (Signed)
Patient presents for ROB. 36 week GBS, GC/CC collected today. Wants to discuss induction plans.

## 2022-05-17 NOTE — Progress Notes (Signed)
FMLA paperwork faxed

## 2022-05-17 NOTE — Progress Notes (Signed)
   PRENATAL VISIT NOTE  Subjective:  Cassandra Avery is a 37 y.o. G2P1001 at [redacted]w[redacted]d being seen today for ongoing prenatal care.  She is currently monitored for the following issues for this high-risk pregnancy and has Supervision of other normal pregnancy, antepartum; History of gestational hypertension; Maternal obesity affecting pregnancy, antepartum; BMI 40.0-44.9, adult ([place]); and Fundal height high for dates on their problem list.  Patient doing well with no acute concerns today. She reports no complaints.  Contractions: Irritability. Vag. Bleeding: None.  Movement: Present. Denies leaking of fluid.   Pt interested in IOL.  Pt advised it would likely be around 40 weeks due to increased BMI unless there was some other medical issue involved.  The following portions of the patient's history were reviewed and updated as appropriate: allergies, current medications, past family history, past medical history, past social history, past surgical history and problem list. Problem list updated.  Objective:   Vitals:   05/17/22 0854  BP: 118/78  Pulse: 98  Weight: 236 lb 12.8 oz (107.4 kg)    Fetal Status: Fetal Heart Rate (bpm): 144 Fundal Height: 39 cm Movement: Present     General:  Alert, oriented and cooperative. Patient is in no acute distress.  Skin: Skin is warm and dry. No rash noted.   Cardiovascular: Normal heart rate noted  Respiratory: Normal respiratory effort, no problems with respiration noted  Abdomen: Soft, gravid, appropriate for gestational age.  Pain/Pressure: Present     Pelvic: Cervical exam performed Dilation: Fingertip Effacement (%): 50 Station: Ballotable  Extremities: Normal range of motion.  Edema: Trace  Mental Status:  Normal mood and affect. Normal behavior. Normal judgment and thought content.   Assessment and Plan:  Pregnancy: G2P1001 at [redacted]w[redacted]d  1. [redacted] weeks gestation of pregnancy  - Culture, beta strep (group b only) - Cervicovaginal ancillary only( [facility])  2. Supervision of other normal pregnancy, antepartum Continue routine prenatal care  3. Obesity affecting pregnancy, antepartum, unspecified obesity type Pt receives weekly BPP  4. History of gestational hypertension BP WNL  5. BMI 40.0-44.9, adult ([place])  6. Fundal height high for dates Fundal height noted to be 38-39 cm, ie 2-3 weeks ahead of current gestational age. Chart reviewed and last EFW was at 90%.  Will try to assess prior to previously scheduled growth scan.  There is potential for delivery at 39 weeks if severe macrosomia is confirmed.  - [place] MFM OB FOLLOW UP; Future  Preterm labor symptoms and general obstetric precautions including but not limited to vaginal bleeding, contractions, leaking of fluid and fetal movement were reviewed in detail with the patient.  Please refer to After Visit Summary for other counseling recommendations.   Return in about 1 week (around 05/24/2022) for ROB, in person.   Lynnda Shields, MD Faculty Attending Center for [facility]

## 2022-05-18 ENCOUNTER — Ambulatory Visit: Payer: Managed Care, Other (non HMO) | Admitting: *Deleted

## 2022-05-18 ENCOUNTER — Ambulatory Visit: Payer: Managed Care, Other (non HMO) | Attending: Obstetrics

## 2022-05-18 VITALS — BP 125/64 | HR 98

## 2022-05-18 DIAGNOSIS — O09293 Supervision of pregnancy with other poor reproductive or obstetric history, third trimester: Secondary | ICD-10-CM | POA: Diagnosis not present

## 2022-05-18 DIAGNOSIS — O285 Abnormal chromosomal and genetic finding on antenatal screening of mother: Secondary | ICD-10-CM

## 2022-05-18 DIAGNOSIS — Z3A36 36 weeks gestation of pregnancy: Secondary | ICD-10-CM | POA: Diagnosis not present

## 2022-05-18 DIAGNOSIS — Z8759 Personal history of other complications of pregnancy, childbirth and the puerperium: Secondary | ICD-10-CM | POA: Insufficient documentation

## 2022-05-18 DIAGNOSIS — O99213 Obesity complicating pregnancy, third trimester: Secondary | ICD-10-CM

## 2022-05-18 DIAGNOSIS — O24113 Pre-existing diabetes mellitus, type 2, in pregnancy, third trimester: Secondary | ICD-10-CM | POA: Insufficient documentation

## 2022-05-18 DIAGNOSIS — Z3490 Encounter for supervision of normal pregnancy, unspecified, unspecified trimester: Secondary | ICD-10-CM

## 2022-05-18 DIAGNOSIS — Z148 Genetic carrier of other disease: Secondary | ICD-10-CM

## 2022-05-18 DIAGNOSIS — E669 Obesity, unspecified: Secondary | ICD-10-CM

## 2022-05-18 DIAGNOSIS — O10913 Unspecified pre-existing hypertension complicating pregnancy, third trimester: Secondary | ICD-10-CM | POA: Diagnosis not present

## 2022-05-18 LAB — CERVICOVAGINAL ANCILLARY ONLY
Chlamydia: NEGATIVE
Comment: NEGATIVE
Comment: NORMAL
Neisseria Gonorrhea: NEGATIVE

## 2022-05-21 LAB — CULTURE, BETA STREP (GROUP B ONLY): Strep Gp B Culture: NEGATIVE

## 2022-05-24 ENCOUNTER — Encounter: Payer: Self-pay | Admitting: Obstetrics

## 2022-05-24 ENCOUNTER — Ambulatory Visit (INDEPENDENT_AMBULATORY_CARE_PROVIDER_SITE_OTHER): Payer: Medicaid Other | Admitting: Obstetrics

## 2022-05-24 ENCOUNTER — Other Ambulatory Visit (HOSPITAL_COMMUNITY)
Admission: RE | Admit: 2022-05-24 | Discharge: 2022-05-24 | Disposition: A | Discharge status: Home / Self Care | Payer: Managed Care, Other (non HMO) | Source: Ambulatory Visit | Attending: Obstetrics | Admitting: Obstetrics

## 2022-05-24 VITALS — BP 122/79 | HR 93 | Wt 241.7 lb

## 2022-05-24 DIAGNOSIS — Z3480 Encounter for supervision of other normal pregnancy, unspecified trimester: Secondary | ICD-10-CM

## 2022-05-24 DIAGNOSIS — N898 Other specified noninflammatory disorders of vagina: Secondary | ICD-10-CM | POA: Insufficient documentation

## 2022-05-24 DIAGNOSIS — Z3483 Encounter for supervision of other normal pregnancy, third trimester: Secondary | ICD-10-CM

## 2022-05-24 DIAGNOSIS — Z8759 Personal history of other complications of pregnancy, childbirth and the puerperium: Secondary | ICD-10-CM

## 2022-05-24 DIAGNOSIS — O99213 Obesity complicating pregnancy, third trimester: Secondary | ICD-10-CM

## 2022-05-24 DIAGNOSIS — Z3A37 37 weeks gestation of pregnancy: Secondary | ICD-10-CM

## 2022-05-24 DIAGNOSIS — O99210 Obesity complicating pregnancy, unspecified trimester: Secondary | ICD-10-CM

## 2022-05-24 NOTE — Progress Notes (Signed)
Pt presents for ROB visit. Pt c/o vaginal itching and discharge for 2 days.

## 2022-05-24 NOTE — Progress Notes (Signed)
Subjective:  Cassandra Avery is a 37 y.o. G2P1001 at [redacted]w[redacted]d being seen today for ongoing prenatal care.  She is currently monitored for the following issues for this low-risk pregnancy and has Supervision of other normal pregnancy, antepartum; History of gestational hypertension; Maternal obesity affecting pregnancy, antepartum; BMI 40.0-44.9, adult ([place]); and Fundal height high for dates on their problem list.  Patient reports vaginal discharge.   .  .   . Denies leaking of fluid.   The following portions of the patient's history were reviewed and updated as appropriate: allergies, current medications, past family history, past medical history, past social history, past surgical history and problem list. Problem list updated.  Objective:  There were no vitals filed for this visit.  Fetal Status:           General:  Alert, oriented and cooperative. Patient is in no acute distress.  Skin: Skin is warm and dry. No rash noted.   Cardiovascular: Normal heart rate noted  Respiratory: Normal respiratory effort, no problems with respiration noted  Abdomen: Soft, gravid, appropriate for gestational age.       Pelvic:  Cervical exam deferred        Extremities: Normal range of motion.     Mental Status: Normal mood and affect. Normal behavior. Normal judgment and thought content.   Urinalysis:      Assessment and Plan:  Pregnancy: G2P1001 at [redacted]w[redacted]d  1. Supervision of other normal pregnancy, antepartum  2. History of gestational hypertension - Baby ASA Rx  3. Obesity affecting pregnancy, antepartum, unspecified obesity type     Term labor symptoms and general obstetric precautions including but not limited to vaginal bleeding, contractions, leaking of fluid and fetal movement were reviewed in detail with the patient. Please refer to After Visit Summary for other counseling recommendations.   Return in about 1 week (around 05/31/2022) for ROB.   Shelly Bombard, MD 05/24/2022

## 2022-05-25 ENCOUNTER — Ambulatory Visit: Payer: Managed Care, Other (non HMO) | Admitting: *Deleted

## 2022-05-25 ENCOUNTER — Ambulatory Visit: Payer: Managed Care, Other (non HMO) | Attending: Maternal & Fetal Medicine

## 2022-05-25 VITALS — BP 131/69 | HR 94

## 2022-05-25 DIAGNOSIS — E669 Obesity, unspecified: Secondary | ICD-10-CM

## 2022-05-25 DIAGNOSIS — O99210 Obesity complicating pregnancy, unspecified trimester: Secondary | ICD-10-CM

## 2022-05-25 DIAGNOSIS — Z3A37 37 weeks gestation of pregnancy: Secondary | ICD-10-CM

## 2022-05-25 DIAGNOSIS — O09293 Supervision of pregnancy with other poor reproductive or obstetric history, third trimester: Secondary | ICD-10-CM | POA: Diagnosis not present

## 2022-05-25 DIAGNOSIS — O280 Abnormal hematological finding on antenatal screening of mother: Secondary | ICD-10-CM | POA: Diagnosis present

## 2022-05-25 DIAGNOSIS — Z8759 Personal history of other complications of pregnancy, childbirth and the puerperium: Secondary | ICD-10-CM | POA: Insufficient documentation

## 2022-05-25 DIAGNOSIS — Z3A39 39 weeks gestation of pregnancy: Secondary | ICD-10-CM | POA: Diagnosis not present

## 2022-05-25 DIAGNOSIS — O99213 Obesity complicating pregnancy, third trimester: Secondary | ICD-10-CM | POA: Diagnosis not present

## 2022-05-25 DIAGNOSIS — O10913 Unspecified pre-existing hypertension complicating pregnancy, third trimester: Secondary | ICD-10-CM | POA: Insufficient documentation

## 2022-05-25 DIAGNOSIS — Z3480 Encounter for supervision of other normal pregnancy, unspecified trimester: Secondary | ICD-10-CM

## 2022-05-25 LAB — CERVICOVAGINAL ANCILLARY ONLY
Bacterial Vaginitis (gardnerella): NEGATIVE
Candida Glabrata: NEGATIVE
Candida Vaginitis: POSITIVE — AB
Chlamydia: NEGATIVE
Comment: NEGATIVE
Comment: NEGATIVE
Comment: NEGATIVE
Comment: NEGATIVE
Comment: NEGATIVE
Comment: NORMAL
Neisseria Gonorrhea: NEGATIVE
Trichomonas: NEGATIVE

## 2022-05-27 ENCOUNTER — Other Ambulatory Visit: Payer: Self-pay | Admitting: Obstetrics

## 2022-05-27 DIAGNOSIS — B379 Candidiasis, unspecified: Secondary | ICD-10-CM

## 2022-05-27 MED ORDER — FLUCONAZOLE 150 MG PO TABS: 150.0000 mg | ORAL_TABLET | Freq: Once | ORAL | 0 refills | Status: AC

## 2022-05-30 ENCOUNTER — Encounter (HOSPITAL_COMMUNITY): Payer: Self-pay | Admitting: Family Medicine

## 2022-05-30 ENCOUNTER — Other Ambulatory Visit: Payer: Self-pay

## 2022-05-30 ENCOUNTER — Inpatient Hospital Stay (HOSPITAL_COMMUNITY)
Admission: AD | Admit: 2022-05-30 | Discharge: 2022-05-30 | Disposition: A | Discharge status: Home / Self Care | Payer: Managed Care, Other (non HMO) | Attending: Family Medicine | Admitting: Family Medicine

## 2022-05-30 DIAGNOSIS — O09293 Supervision of pregnancy with other poor reproductive or obstetric history, third trimester: Secondary | ICD-10-CM | POA: Diagnosis not present

## 2022-05-30 DIAGNOSIS — O99213 Obesity complicating pregnancy, third trimester: Secondary | ICD-10-CM | POA: Insufficient documentation

## 2022-05-30 DIAGNOSIS — Z3A38 38 weeks gestation of pregnancy: Secondary | ICD-10-CM | POA: Diagnosis not present

## 2022-05-30 DIAGNOSIS — Z3480 Encounter for supervision of other normal pregnancy, unspecified trimester: Secondary | ICD-10-CM

## 2022-05-30 DIAGNOSIS — Z8759 Personal history of other complications of pregnancy, childbirth and the puerperium: Secondary | ICD-10-CM

## 2022-05-30 DIAGNOSIS — O99210 Obesity complicating pregnancy, unspecified trimester: Secondary | ICD-10-CM

## 2022-05-30 DIAGNOSIS — R079 Chest pain, unspecified: Secondary | ICD-10-CM | POA: Diagnosis not present

## 2022-05-30 DIAGNOSIS — K219 Gastro-esophageal reflux disease without esophagitis: Secondary | ICD-10-CM | POA: Diagnosis not present

## 2022-05-30 DIAGNOSIS — E669 Obesity, unspecified: Secondary | ICD-10-CM | POA: Insufficient documentation

## 2022-05-30 DIAGNOSIS — R1013 Epigastric pain: Secondary | ICD-10-CM | POA: Insufficient documentation

## 2022-05-30 DIAGNOSIS — O26893 Other specified pregnancy related conditions, third trimester: Secondary | ICD-10-CM | POA: Insufficient documentation

## 2022-05-30 LAB — URINALYSIS, ROUTINE W REFLEX MICROSCOPIC
Bilirubin Urine: NEGATIVE
Glucose, UA: NEGATIVE mg/dL
Hgb urine dipstick: NEGATIVE
Ketones, ur: 5 mg/dL — AB
Leukocytes,Ua: NEGATIVE
Nitrite: NEGATIVE
Protein, ur: NEGATIVE mg/dL
Specific Gravity, Urine: 1.004 — ABNORMAL LOW (ref 1.005–1.030)
pH: 6 (ref 5.0–8.0)

## 2022-05-30 LAB — COMPREHENSIVE METABOLIC PANEL
ALT: 12 U/L (ref 0–44)
AST: 16 U/L (ref 15–41)
Albumin: 2.6 g/dL — ABNORMAL LOW (ref 3.5–5.0)
Alkaline Phosphatase: 131 U/L — ABNORMAL HIGH (ref 38–126)
Anion gap: 10 (ref 5–15)
BUN: <5 mg/dL — ABNORMAL LOW (ref 6–20)
CO2: 20 mmol/L — ABNORMAL LOW (ref 22–32)
Calcium: 8.9 mg/dL (ref 8.9–10.3)
Chloride: 105 mmol/L (ref 98–111)
Creatinine, Ser: 0.49 mg/dL (ref 0.44–1.00)
GFR, Estimated: >60 mL/min (ref >60)
Glucose, Bld: 65 mg/dL — ABNORMAL LOW (ref 70–99)
Potassium: 3.4 mmol/L — ABNORMAL LOW (ref 3.5–5.1)
Sodium: 135 mmol/L (ref 135–145)
Total Bilirubin: 0.3 mg/dL (ref 0.3–1.2)
Total Protein: 5.8 g/dL — ABNORMAL LOW (ref 6.5–8.1)

## 2022-05-30 LAB — CBC
HCT: 31 % — ABNORMAL LOW (ref 36.0–46.0)
Hemoglobin: 9.4 g/dL — ABNORMAL LOW (ref 12.0–15.0)
MCH: 25.9 pg — ABNORMAL LOW (ref 26.0–34.0)
MCHC: 30.3 g/dL (ref 30.0–36.0)
MCV: 85.4 fL (ref 80.0–100.0)
Platelets: 260 10*3/uL (ref 150–400)
RBC: 3.63 MIL/uL — ABNORMAL LOW (ref 3.87–5.11)
RDW: 14.5 % (ref 11.5–15.5)
WBC: 7.9 10*3/uL (ref 4.0–10.5)
nRBC: 0 % (ref 0.0–0.2)

## 2022-05-30 MED ORDER — DICYCLOMINE HCL 10 MG/5ML PO SOLN
10.0000 mg | Freq: Once | ORAL | Status: AC
  Administered 2022-05-30: 10 mg via ORAL
  Filled 2022-05-30 (×2): qty 5

## 2022-05-30 MED ORDER — PANTOPRAZOLE SODIUM 20 MG PO TBEC: 20.0000 mg | DELAYED_RELEASE_TABLET | Freq: Two times a day (BID) | ORAL | 1 refills | Status: DC

## 2022-05-30 MED ORDER — ALUM & MAG HYDROXIDE-SIMETH 200-200-20 MG/5ML PO SUSP
30.0000 mL | Freq: Once | ORAL | Status: AC
  Administered 2022-05-30: 30 mL via ORAL
  Filled 2022-05-30: qty 30

## 2022-05-30 MED ORDER — FAMOTIDINE 20 MG PO TABS
20.0000 mg | ORAL_TABLET | Freq: Once | ORAL | Status: AC
  Administered 2022-05-30: 20 mg via ORAL
  Filled 2022-05-30: qty 1

## 2022-05-30 NOTE — MAU Provider Note (Signed)
History     [id number]  Arrival date and time: 05/30/22 1204    Chief Complaint  Patient presents with   Cramping   Back Pain   Contractions   Chest Tightness     HPI Cassandra Avery is a 37 y.o. G2P1001 at [redacted]w[redacted]d by LMP with PMHx notable for Gestational HTN in her previous pregnancy and recent yeast infection, who presents for 7/10 chest pain that began around 0700 today on 05/30/22. Pt also endorses headache, nausea and dizziness at rest and denies visual changes, vomiting, diarrhea, VB and LOF. Pt informs she did not experience similar chest pain from her previous pregnancy despite presence of Gestional HTN. Previous pregnancy was induced at 39 weeks and pt delivered vaginally w/o complications. Pt was administered one dose of Fluconazole taken at 3 pm yesterday on 05/29/22 for yeast infection.   Review of outside prenatal records from [employer] [facility] (in media tab): Records indicate Gestational HTN during her previous pregnancy. Has not encountered complications of HTN in her current pregnancy.   Vaginal bleeding: No LOF: Yes Fetal Movement: Yes Contractions: Yes  O/Positive/-- (07/18 1046)  OB History     Gravida  2   Para  1   Term  1   Preterm  0   AB  0   Living  1      SAB  0   IAB  0   Ectopic  0   Multiple      Live Births  1           Past Medical History:  Diagnosis Date   Gestational hypertension 01/26/2017   Headache    Recurrent upper respiratory infection (URI)     Past Surgical History:  Procedure Laterality Date   NO PAST SURGERIES      Family History  Problem Relation Age of Onset   Miscarriages / Stillbirths Mother    Stroke Father    Asthma Sister    Stroke Paternal Grandmother    Hearing loss Paternal Grandfather     Social History   Socioeconomic History   Marital status: Married    Spouse name: Not on file   Number of children: Not on file   Years of education: Not on file   Highest education level: Not on file   Occupational History   Not on file  Tobacco Use   Smoking status: Never   Smokeless tobacco: Never  Vaping Use   Vaping Use: Never used  Substance and Sexual Activity   Alcohol use: Not Currently    Comment: socially, prior to pregnancy   Drug use: No   Sexual activity: Not Currently    Birth control/protection: None    Comment: pregnant  Other Topics Concern   Not on file  Social History Narrative   ** Merged History Encounter **       Social Determinants of Health   Financial Resource Strain: Not on file  Food Insecurity: Not on file  Transportation Needs: Not on file  Physical Activity: Not on file  Stress: Not on file  Social Connections: Not on file  Intimate Partner Violence: Not on file    No Known Allergies  No current facility-administered medications on file prior to encounter.   Current Outpatient Medications on File Prior to Encounter  Medication Sig Dispense Refill   aspirin EC 81 MG tablet Take 1 tablet (81 mg total) by mouth daily. Take after 12 weeks for prevention of preeclampsia later in pregnancy 300 tablet  2   ferrous sulfate (FERROUSUL) 325 (65 FE) MG tablet Take 1 tablet (325 mg total) by mouth every other day. 30 tablet 5   Prenatal MV-Min-FA-Omega-3 (PRENATAL GUMMIES/DHA & FA) 0.4-32.5 MG CHEW Chew 2 each by mouth at bedtime.     Blood Pressure Monitoring (BLOOD PRESSURE KIT) DEVI 1 kit by Does not apply route once a week. 1 each 0     Review of Systems  Constitutional:  Negative for chills and fever.  Eyes:  Negative for blurred vision, double vision and photophobia.  Respiratory:  Negative for cough and shortness of breath.   Cardiovascular:  Positive for chest pain.  Gastrointestinal:  Positive for abdominal pain, heartburn and nausea. Negative for diarrhea and vomiting.  Genitourinary:  Negative for dysuria.  Musculoskeletal:  Positive for back pain.  Skin:  Negative for itching.  Neurological:  Positive for dizziness.   Psychiatric/Behavioral: Negative.     Pertinent positives and negative per HPI, all others reviewed and negative  Physical Exam   BP 137/74   Pulse 91   Temp 98.9 F (37.2 C) (Oral)   Resp 18   Ht 5\' 2"  (1.575 m)   Wt 108.1 kg   LMP 09/04/2021 (Exact Date)   SpO2 100%   BMI 43.60 kg/m   Patient Vitals for the past 24 hrs:  BP Temp Temp src Pulse Resp SpO2 Height Weight  05/30/22 1602 137/74 -- -- 91 -- -- -- --  05/30/22 1318 135/75 -- -- 95 -- -- -- --  05/30/22 1300 137/78 98.9 F (37.2 C) Oral 96 18 100 % -- --  05/30/22 1253 -- -- -- -- -- -- 5\' 2"  (1.575 m) 108.1 kg   Physical Exam Vitals and nursing note reviewed.  Constitutional:      Appearance: Normal appearance.  HENT:     Head: Normocephalic.  Eyes:     Extraocular Movements: Extraocular movements intact.     Pupils: Pupils are equal, round, and reactive to light.  Cardiovascular:     Rate and Rhythm: Normal rate and regular rhythm.     Heart sounds: No murmur heard.    No friction rub.  Pulmonary:     Effort: Pulmonary effort is normal. No respiratory distress.     Breath sounds: No stridor. No wheezing.  Abdominal:     Palpations: Abdomen is soft.     Tenderness: There is abdominal tenderness.  Musculoskeletal:        General: Normal range of motion.     Cervical back: Normal range of motion.  Skin:    General: Skin is warm and dry.  Neurological:     General: No focal deficit present.     Mental Status: She is alert.  Psychiatric:        Mood and Affect: Mood normal.    Cervical Exam Dilation: Closed (externally FT) Exam by:: Dr. Lamarr Lulas closed  Bedside Ultrasound N/A  My interpretation: N/A  FHT Baseline 130 bpm, moderate variability, normal accels, no decels Toco: No Contractions Cat: 1  Labs Results for orders placed or performed during the hospital encounter of 05/30/22 (from the past 24 hour(s))  Urinalysis, Routine w reflex microscopic Urine, Clean Catch     Status:  Abnormal   Collection Time: 05/30/22  1:40 PM  Result Value Ref Range   Color, Urine STRAW (A) YELLOW   APPearance CLEAR CLEAR   Specific Gravity, Urine 1.004 (L) 1.005 - 1.030   pH 6.0 5.0 - 8.0  Glucose, UA NEGATIVE NEGATIVE mg/dL   Hgb urine dipstick NEGATIVE NEGATIVE   Bilirubin Urine NEGATIVE NEGATIVE   Ketones, ur 5 (A) NEGATIVE mg/dL   Protein, ur NEGATIVE NEGATIVE mg/dL   Nitrite NEGATIVE NEGATIVE   Leukocytes,Ua NEGATIVE NEGATIVE  CBC     Status: Abnormal   Collection Time: 05/30/22  2:17 PM  Result Value Ref Range   WBC 7.9 4.0 - 10.5 K/uL   RBC 3.63 (L) 3.87 - 5.11 MIL/uL   Hemoglobin 9.4 (L) 12.0 - 15.0 g/dL   HCT 31.0 (L) 36.0 - 46.0 %   MCV 85.4 80.0 - 100.0 fL   MCH 25.9 (L) 26.0 - 34.0 pg   MCHC 30.3 30.0 - 36.0 g/dL   RDW 14.5 11.5 - 15.5 %   Platelets 260 150 - 400 K/uL   nRBC 0.0 0.0 - 0.2 %  Comprehensive metabolic panel     Status: Abnormal   Collection Time: 05/30/22  2:17 PM  Result Value Ref Range   Sodium 135 135 - 145 mmol/L   Potassium 3.4 (L) 3.5 - 5.1 mmol/L   Chloride 105 98 - 111 mmol/L   CO2 20 (L) 22 - 32 mmol/L   Glucose, Bld 65 (L) 70 - 99 mg/dL   BUN <5 (L) 6 - 20 mg/dL   Creatinine, Ser 0.49 0.44 - 1.00 mg/dL   Calcium 8.9 8.9 - 10.3 mg/dL   Total Protein 5.8 (L) 6.5 - 8.1 g/dL   Albumin 2.6 (L) 3.5 - 5.0 g/dL   AST 16 15 - 41 U/L   ALT 12 0 - 44 U/L   Alkaline Phosphatase 131 (H) 38 - 126 U/L   Total Bilirubin 0.3 0.3 - 1.2 mg/dL   GFR, Estimated >60 >60 mL/min   Anion gap 10 5 - 15    Imaging No results found.  MAU Course  Procedures Lab Orders         Urinalysis, Routine w reflex microscopic         CBC         Comprehensive metabolic panel    Meds ordered this encounter  Medications   famotidine (PEPCID) tablet 20 mg   alum & mag hydroxide-simeth (MAALOX/MYLANTA) 200-200-20 MG/5ML suspension 30 mL   dicyclomine (BENTYL) 10 MG/5ML solution 10 mg   pantoprazole (PROTONIX) 20 MG tablet    Sig: Take 1 tablet (20 mg  total) by mouth 2 (two) times daily.    Dispense:  30 tablet    Refill:  1   Imaging Orders  No imaging studies ordered today    MDM Famotidine 20 mg PO CBC/CMP Famotidine was not helpful, GI cocktail administered at 1500 Monitor BP w/ consideration of PMH of Gestational HTN   Assessment and Plan  Lab work including and CBC and CMP was obtained effectively ruling out HELLP syndrome and PreE. Pt was administered Famotidine PO which was found to be unhelpful and was later administered Maalox and Viscous Lidocaine which improved her chest and epigastric pain. At this time, there is no concern for maternal cardiac complications considering BP reading within normal limits and normal heart auscultation.  Pt is likely experiencing symptoms of reflux and costochondral discomfort from current cephalic lie position. Will be discharged and prescribed Pantoprazole for at home use. Pt was made aware to return to the MAU should she experience heavy vaginal bleeding or symptoms of preeclampsia.    #FWB NST: Reactive   Dispo: discharged to home in stable condition.  Bradly Bienenstock,  Student-PA/MPH 05/30/22 4:51 PM  Allergies as of 05/30/2022   No Known Allergies      Medication List     TAKE these medications    aspirin EC 81 MG tablet Take 1 tablet (81 mg total) by mouth daily. Take after 12 weeks for prevention of preeclampsia later in pregnancy   Blood Pressure Kit Devi 1 kit by Does not apply route once a week.   ferrous sulfate 325 (65 FE) MG tablet Commonly known as: FerrouSul Take 1 tablet (325 mg total) by mouth every other day.   pantoprazole 20 MG tablet Commonly known as: Protonix Take 1 tablet (20 mg total) by mouth 2 (two) times daily.   Prenatal Gummies/DHA & FA 0.4-32.5 MG Chew Chew 2 each by mouth at bedtime.

## 2022-05-30 NOTE — MAU Note (Signed)
Cassandra Avery is a 37 y.o. at [redacted]w[redacted]d here in MAU reporting: she's having cramping, ctxs and back pain.  Reports unable to time ctxs secondary cramping is so consistent.  Denies VB, unsure if LOF.  Reports +FM.   Also c/o  tightness in center of chest, reports pain is constant and had been occurring for a couple hours. LMP: NA Onset of complaint: today Pain score: 7 chest & 7 ctxs/cramping Vitals:   05/30/22 1300  BP: 137/78  Pulse: 96  Resp: 18  Temp: 98.9 F (37.2 C)  SpO2: 100%     FHT: 142 bpm Lab orders placed from triage:   UA

## 2022-05-31 ENCOUNTER — Encounter: Payer: Self-pay | Admitting: Obstetrics

## 2022-05-31 ENCOUNTER — Ambulatory Visit (INDEPENDENT_AMBULATORY_CARE_PROVIDER_SITE_OTHER): Payer: Medicaid Other | Admitting: Obstetrics

## 2022-05-31 VITALS — BP 122/84 | HR 90 | Wt 235.4 lb

## 2022-05-31 DIAGNOSIS — Z3480 Encounter for supervision of other normal pregnancy, unspecified trimester: Secondary | ICD-10-CM

## 2022-05-31 DIAGNOSIS — Z3A38 38 weeks gestation of pregnancy: Secondary | ICD-10-CM

## 2022-05-31 DIAGNOSIS — Z8759 Personal history of other complications of pregnancy, childbirth and the puerperium: Secondary | ICD-10-CM

## 2022-05-31 DIAGNOSIS — Z3483 Encounter for supervision of other normal pregnancy, third trimester: Secondary | ICD-10-CM

## 2022-05-31 DIAGNOSIS — O99210 Obesity complicating pregnancy, unspecified trimester: Secondary | ICD-10-CM

## 2022-05-31 NOTE — Progress Notes (Signed)
Patient presents for [place] visit. Pt would like to discuss the plan for induction. No other concerns at this time.

## 2022-05-31 NOTE — Progress Notes (Addendum)
Subjective:  Cassandra Avery is a 37 y.o. G2P1001 at 8043w3d being seen today for ongoing prenatal care.  She is currently monitored for the following issues for this low-risk pregnancy and has Supervision of other normal pregnancy, antepartum; History of gestational hypertension; Maternal obesity affecting pregnancy, antepartum; BMI 40.0-44.9, adult (HCC); and Fundal height high for dates on their problem list.  Patient reports no complaints.  Contractions: Irregular. Vag. Bleeding: None.  Movement: Present. Denies leaking of fluid.   The following portions of the patient's history were reviewed and updated as appropriate: allergies, current medications, past family history, past medical history, past social history, past surgical history and problem list. Problem list updated.  Objective:   Vitals:   05/31/22 0837  BP: 122/84  Pulse: 90  Weight: 235 lb 6.4 oz (106.8 kg)    Fetal Status: Fetal Heart Rate (bpm): 146   Movement: Present     General:  Alert, oriented and cooperative. Patient is in no acute distress.  Skin: Skin is warm and dry. No rash noted.   Cardiovascular: Normal heart rate noted  Respiratory: Normal respiratory effort, no problems with respiration noted  Abdomen: Soft, gravid, appropriate for gestational age. Pain/Pressure: Present     Pelvic:  Cervical exam deferred        Extremities: Normal range of motion.  Edema: Mild pitting, slight indentation  Mental Status: Normal mood and affect. Normal behavior. Normal judgment and thought content.   Urinalysis:        US MFM OB FOLLOW UP (Accession [id number]) (Order [id number]) Imaging Date: 05/18/2022 [facility] Released By: Lauralee EvenerBracken, Carla D Authorizing: Warden FillersBass, Lawrence A, MD   Exam Status  Status  Final [99]   Study Result  Narrative & Impression  ----------------------------------------------------------------------  OBSTETRICS REPORT                       (Signed  Final 05/18/2022 11:32 am) ---------------------------------------------------------------------- Patient Info  ID #:       [id number]                          D.O.B.:  10/22/1985 (36 yrs)  Name:       Cassandra HugerRESHA Villalon                     Visit Date: 05/18/2022 10:53 am ---------------------------------------------------------------------- Performed By  Attending:        Noralee Spaceavi Shankar MD        Ref. Address:     Faculty  Performed By:     Earley BrookeNicole S Dalrymple     [facility]                    BS, RDMS                                 [facility]                                                             [facility]  Referred By:      Gigi Gin                    CONSTANT MD ---------------------------------------------------------------------- Orders  #  Description                           Code        Ordered By  1  [place] MFM FETAL BPP WO NON               76819.01    YU FANG     STRESS  2  [place] MFM OB FOLLOW UP                   76816.01    Mariel Aloe ----------------------------------------------------------------------  #  Order #                     Accession #                Episode #  1  [id number]                   [id number]                 [id number]  2  [id number]                   [id number]                 [id number] ---------------------------------------------------------------------- Indications  Obesity complicating pregnancy, third          O99.213  trimester (BMI: 42)  [redacted] weeks gestation of pregnancy                Z3A.36  Poor obstetric history: Previous gestational   O09.299  HTN  NIPS LR female, Horizon increased risk SMA ---------------------------------------------------------------------- Vital Signs                                                 Height:        5'2" ---------------------------------------------------------------------- Fetal Evaluation  Num Of Fetuses:         1   Fetal Heart Rate(bpm):  157  Cardiac Activity:       Observed  Presentation:           Cephalic  Placenta:               Anterior  P. Cord Insertion:      Previously seen as normal  Amniotic Fluid  AFI FV:      Within normal limits  AFI Sum(cm)     %Tile       Largest Pocket(cm)  9.8             21          4.9  RUQ(cm)       RLQ(cm)       LUQ(cm)        LLQ(cm)  4.9           1.5           3.4            0 ---------------------------------------------------------------------- Biophysical Evaluation  Amniotic F.V:   Pocket => 2 cm  F. Tone:        Observed  F. Movement:    Observed                   Score:          8/8  F. Breathing:   Observed ---------------------------------------------------------------------- Biometry  BPD:      95.2  mm     G. Age:  38w 6d         98  %    CI:        78.53   %    70 - 86                                                          FL/HC:      20.1   %    20.8 - 22.6  HC:      339.8  mm     G. Age:  39w 1d         81  %    HC/AC:      0.96        0.92 - 1.05  AC:      354.5  mm     G. Age:  39w 3d       > 99  %    FL/BPD:     71.7   %    71 - 87  FL:       68.3  mm     G. Age:  35w 1d         14  %    FL/AC:      19.3   %    20 - 24  HUM:      59.2  mm     G. Age:  34w 2d         26  %  LV:        6.4  mm  Est. FW:    3466  gm    7 lb 10 oz      92  % ---------------------------------------------------------------------- OB History  Gravidity:    2  Living:       1 ---------------------------------------------------------------------- Gestational Age  LMP:           36w 4d        Date:  09/04/21                  EDD:   06/11/22  U/S Today:     38w 1d                                        EDD:   05/31/22  Best:          36w 4d     Det. By:  U/S C R L  (11/02/21)    EDD:   06/11/22 ---------------------------------------------------------------------- Anatomy  Cranium:               Appears normal         LVOT:                   Previously  seen  Cavum:  Previously seen        Aortic Arch:            Previously seen  Ventricles:            Appears normal         Ductal Arch:            Previously seen  Choroid Plexus:        Previously seen        Diaphragm:              Appears normal  Cerebellum:            Previously seen        Stomach:                Appears normal, left                                                                        sided  Posterior Fossa:       Previously seen        Abdomen:                Appears normal  Nuchal Fold:           Previously seen        Abdominal Wall:         Previously seen  Face:                  Orbits and profile     Cord Vessels:           Previously seen                         previously seen  Lips:                  Previously seen        Kidneys:                Appear normal  Palate:                Appears normal         Bladder:                Appears normal  Thoracic:              Previously seen        Spine:                  Previously seen  Heart:                 Previously seen        Upper Extremities:      Previously seen  RVOT:                  Previously seen        Lower Extremities:      Previously seen  Other:  Female gender previously seen. Nasal bone, lenses, maxilla, mandible          and falx, Heels/feet and open hands/5th digits, VC, 3VV and 3VTV          previously visualized. ---------------------------------------------------------------------- Cervix Uterus  Adnexa  Cervix  Not visualized (advanced GA >24wks) ---------------------------------------------------------------------- Impression  Prepregnancy BMI 42.  Patient does not have gestational  diabetes.  Blood pressure today at her office is 125/64 mmHg.  Fetal growth is appropriate for gestational age .Amniotic fluid  is normal and good fetal activity seen.  Cephalic presentation.  Antenatal testing is reassuring.  BPP  8/8. ---------------------------------------------------------------------- Recommendations  -Continue weekly BPP till delivery. ----------------------------------------------------------------------                 Noralee Space, MD Electronically Signed Final Report   05/18/2022 11:32 am ----------------------------------------------------------------------      [place] MFM FETAL BPP WO NON STRESS (Accession [id number]) (Order [id number]) Imaging Date: 05/25/2022 Department: [facility] Released By: Elesa Hacker Authorizing: Georgana Curio, MD   Exam Status  Status  Final [99]   PACS Intelerad Image Link   Show images for [place] MFM FETAL BPP WO NON STRESS Study Result  Narrative & Impression  ----------------------------------------------------------------------  OBSTETRICS REPORT                       (Signed Final 05/25/2022 04:00 pm) ---------------------------------------------------------------------- Patient Info  ID #:       [id number]                          D.O.B.:  December 20, 1985 (36 yrs)  Name:       Cassandra Avery                     Visit Date: 05/25/2022 02:58 pm ---------------------------------------------------------------------- Performed By  Attending:        Braxton Feathers DO       Ref. Address:     [address]                                                             [address]                                                             [place]                                                             [address]  Performed By:     Earley Brooke     Location:         [facility]  BS, RDMS                                 [facility] at                                                             [facility]                                                             [facility]  Referred By:      [facility]  [place] ---------------------------------------------------------------------- Orders  #  Description                           Code        Ordered By  1  [place] MFM FETAL BPP WO NON               76819.01    CORENTHIAN     STRESS                                            BOOKER ----------------------------------------------------------------------  #  Order #                     Accession #                Episode #  1  [id number]                   [id number]                 [id number] ---------------------------------------------------------------------- Indications  Poor obstetric history: Previous gestational   O09.299  HTN  Obesity complicating pregnancy, third          O99.213  trimester (BMI: 42)  [redacted] weeks gestation of pregnancy                Z3A.37  NIPS LR female, Horizon increased risk SMA ---------------------------------------------------------------------- Vital Signs                                                 Height:        5'2" ---------------------------------------------------------------------- Fetal Evaluation  Num Of Fetuses:         1  Fetal Heart Rate(bpm):  137  Cardiac Activity:       Observed  Presentation:           Cephalic  Placenta:               Anterior  P. Cord Insertion:      Previously seen as normal  Amniotic Fluid  AFI FV:      Within normal limits  AFI Sum(cm)     %Tile  Largest Pocket(cm)  18.74           73          7.96  RUQ(cm)       RLQ(cm)       LUQ(cm)        LLQ(cm)  0             6.8           7.96           3.98 ---------------------------------------------------------------------- Biophysical Evaluation  Amniotic F.V:   Within normal limits       F. Tone:        Observed  F. Movement:    Observed                   Score:          8/8  F. Breathing:   Observed ---------------------------------------------------------------------- OB History  Gravidity:    2  Living:        1 ---------------------------------------------------------------------- Gestational Age  LMP:           37w 4d        Date:  09/04/21                  EDD:   06/11/22  Best:          37w 4d     Det. By:  U/S C R L  (11/02/21)    EDD:   06/11/22 ---------------------------------------------------------------------- Comments  The patient is here for a BPP. She is at 37w 4d. EDD of  06/11/2022 dated by: U/S C R L  (11/02/21). She has no  concerns today.  Sonographic findings  Single intrauterine pregnancy.  Fetal cardiac activity: Observed.  Presentation: Cephalic.  Interval fetal anatomy appears normal.  Amniotic fluid volume: Within normal limits. AFI: 18.74 cm.  MVP: 7.96 cm.  Placenta: Anterior.  BPP: 8/8.  Recommendations  1. BBPs weekly until delivery  2. Growth ultrasounds every 4 weeks until delivery  3. Delivery around [redacted] weeks gestation ----------------------------------------------------------------------                  Valeda Malm, DO Electronically Signed Final Report   05/25/2022 04:00 pm ----------------------------------------------------------------------     Assessment and Plan:  Pregnancy: G2P1001 at [redacted]w[redacted]d  1. Supervision of other normal pregnancy, antepartum  2. History of gestational hypertension - Baby ASA Rx  3. Obesity affecting pregnancy, antepartum, unspecified obesity type  4. LGA fetus ( 11 percentile at 37 weeks } - delivery at around 39 weeks, per MFM    Term labor symptoms and general obstetric precautions including but not limited to vaginal bleeding, contractions, leaking of fluid and fetal movement were reviewed in detail with the patient. Please refer to After Visit Summary for other counseling recommendations.   Return in about 1 week (around 06/07/2022) for [place].   Shelly Bombard, MD 05/31/2022

## 2022-06-01 ENCOUNTER — Telehealth (HOSPITAL_COMMUNITY): Payer: Self-pay | Admitting: *Deleted

## 2022-06-01 ENCOUNTER — Encounter (HOSPITAL_COMMUNITY): Payer: Self-pay

## 2022-06-01 ENCOUNTER — Other Ambulatory Visit: Payer: Self-pay | Admitting: Advanced Practice Midwife

## 2022-06-01 ENCOUNTER — Ambulatory Visit: Payer: Managed Care, Other (non HMO) | Admitting: *Deleted

## 2022-06-01 ENCOUNTER — Ambulatory Visit (HOSPITAL_BASED_OUTPATIENT_CLINIC_OR_DEPARTMENT_OTHER): Payer: Managed Care, Other (non HMO)

## 2022-06-01 VITALS — BP 126/77 | HR 100

## 2022-06-01 DIAGNOSIS — Z148 Genetic carrier of other disease: Secondary | ICD-10-CM

## 2022-06-01 DIAGNOSIS — Z8759 Personal history of other complications of pregnancy, childbirth and the puerperium: Secondary | ICD-10-CM | POA: Insufficient documentation

## 2022-06-01 DIAGNOSIS — Z3A38 38 weeks gestation of pregnancy: Secondary | ICD-10-CM

## 2022-06-01 DIAGNOSIS — O280 Abnormal hematological finding on antenatal screening of mother: Secondary | ICD-10-CM | POA: Insufficient documentation

## 2022-06-01 DIAGNOSIS — O99213 Obesity complicating pregnancy, third trimester: Secondary | ICD-10-CM | POA: Diagnosis not present

## 2022-06-01 DIAGNOSIS — E669 Obesity, unspecified: Secondary | ICD-10-CM

## 2022-06-01 DIAGNOSIS — O09293 Supervision of pregnancy with other poor reproductive or obstetric history, third trimester: Secondary | ICD-10-CM | POA: Diagnosis not present

## 2022-06-01 DIAGNOSIS — O285 Abnormal chromosomal and genetic finding on antenatal screening of mother: Secondary | ICD-10-CM

## 2022-06-01 NOTE — Telephone Encounter (Signed)
Preadmission screen  

## 2022-06-02 ENCOUNTER — Telehealth (HOSPITAL_COMMUNITY): Payer: Self-pay | Admitting: *Deleted

## 2022-06-02 ENCOUNTER — Other Ambulatory Visit (HOSPITAL_COMMUNITY): Payer: Self-pay | Admitting: Advanced Practice Midwife

## 2022-06-02 ENCOUNTER — Encounter (HOSPITAL_COMMUNITY): Payer: Self-pay | Admitting: *Deleted

## 2022-06-02 NOTE — Telephone Encounter (Signed)
Preadmission screen  

## 2022-06-03 ENCOUNTER — Other Ambulatory Visit: Payer: Self-pay | Admitting: Advanced Practice Midwife

## 2022-06-04 ENCOUNTER — Other Ambulatory Visit: Payer: Self-pay

## 2022-06-04 ENCOUNTER — Inpatient Hospital Stay (HOSPITAL_COMMUNITY)
Admission: RE | Admit: 2022-06-04 | Discharge: 2022-06-07 | DRG: 788 | Disposition: A | Discharge status: Home / Self Care | Payer: Managed Care, Other (non HMO) | Attending: Obstetrics & Gynecology | Admitting: Obstetrics & Gynecology

## 2022-06-04 ENCOUNTER — Inpatient Hospital Stay (HOSPITAL_COMMUNITY): Payer: Managed Care, Other (non HMO)

## 2022-06-04 ENCOUNTER — Encounter (HOSPITAL_COMMUNITY): Payer: Self-pay | Admitting: Obstetrics & Gynecology

## 2022-06-04 DIAGNOSIS — O99214 Obesity complicating childbirth: Secondary | ICD-10-CM | POA: Diagnosis present

## 2022-06-04 DIAGNOSIS — Z3480 Encounter for supervision of other normal pregnancy, unspecified trimester: Secondary | ICD-10-CM

## 2022-06-04 DIAGNOSIS — Z98891 History of uterine scar from previous surgery: Secondary | ICD-10-CM

## 2022-06-04 DIAGNOSIS — Z3A39 39 weeks gestation of pregnancy: Secondary | ICD-10-CM | POA: Diagnosis not present

## 2022-06-04 DIAGNOSIS — O9902 Anemia complicating childbirth: Secondary | ICD-10-CM | POA: Diagnosis present

## 2022-06-04 DIAGNOSIS — O3663X0 Maternal care for excessive fetal growth, third trimester, not applicable or unspecified: Principal | ICD-10-CM | POA: Diagnosis present

## 2022-06-04 DIAGNOSIS — O164 Unspecified maternal hypertension, complicating childbirth: Secondary | ICD-10-CM | POA: Diagnosis not present

## 2022-06-04 DIAGNOSIS — Z6841 Body Mass Index (BMI) 40.0 and over, adult: Secondary | ICD-10-CM

## 2022-06-04 DIAGNOSIS — Z8759 Personal history of other complications of pregnancy, childbirth and the puerperium: Secondary | ICD-10-CM

## 2022-06-04 DIAGNOSIS — O134 Gestational [pregnancy-induced] hypertension without significant proteinuria, complicating childbirth: Secondary | ICD-10-CM | POA: Diagnosis not present

## 2022-06-04 LAB — CBC
HCT: 31.4 % — ABNORMAL LOW (ref 36.0–46.0)
Hemoglobin: 9.5 g/dL — ABNORMAL LOW (ref 12.0–15.0)
MCH: 25.5 pg — ABNORMAL LOW (ref 26.0–34.0)
MCHC: 30.3 g/dL (ref 30.0–36.0)
MCV: 84.4 fL (ref 80.0–100.0)
Platelets: 285 10*3/uL (ref 150–400)
RBC: 3.72 MIL/uL — ABNORMAL LOW (ref 3.87–5.11)
RDW: 14.6 % (ref 11.5–15.5)
WBC: 7.1 10*3/uL (ref 4.0–10.5)
nRBC: 0 % (ref 0.0–0.2)

## 2022-06-04 LAB — RPR: RPR Ser Ql: NONREACTIVE

## 2022-06-04 LAB — TYPE AND SCREEN
ABO/RH(D): O POS
Antibody Screen: NEGATIVE

## 2022-06-04 MED ORDER — ACETAMINOPHEN 325 MG PO TABS: 650.0000 mg | ORAL_TABLET | ORAL | Status: DC | PRN

## 2022-06-04 MED ORDER — LACTATED RINGERS IV SOLN: INTRAVENOUS | Status: DC

## 2022-06-04 MED ORDER — ONDANSETRON HCL 4 MG/2ML IJ SOLN
4.0000 mg | Freq: Four times a day (QID) | INTRAMUSCULAR | Status: DC | PRN
  Administered 2022-06-05: 4 mg via INTRAVENOUS
  Filled 2022-06-04: qty 2

## 2022-06-04 MED ORDER — TERBUTALINE SULFATE 1 MG/ML IJ SOLN
0.2500 mg | Freq: Once | INTRAMUSCULAR | Status: AC | PRN
  Administered 2022-06-05: 0.25 mg via SUBCUTANEOUS
  Filled 2022-06-04: qty 1

## 2022-06-04 MED ORDER — MISOPROSTOL 25 MCG QUARTER TABLET
25.0000 ug | ORAL_TABLET | ORAL | Status: DC
  Administered 2022-06-04: 25 ug via VAGINAL
  Filled 2022-06-04: qty 1

## 2022-06-04 MED ORDER — MISOPROSTOL 25 MCG QUARTER TABLET
25.0000 ug | ORAL_TABLET | Freq: Once | ORAL | Status: AC
  Administered 2022-06-04: 25 ug via VAGINAL
  Filled 2022-06-04: qty 1

## 2022-06-04 MED ORDER — MISOPROSTOL 50MCG HALF TABLET
50.0000 ug | ORAL_TABLET | Freq: Once | ORAL | Status: AC
  Administered 2022-06-04: 50 ug via ORAL
  Filled 2022-06-04: qty 1

## 2022-06-04 MED ORDER — LIDOCAINE HCL (PF) 1 % IJ SOLN: 30.0000 mL | INTRAMUSCULAR | Status: DC | PRN

## 2022-06-04 MED ORDER — SOD CITRATE-CITRIC ACID 500-334 MG/5ML PO SOLN
30.0000 mL | ORAL | Status: DC | PRN
  Administered 2022-06-05: 30 mL via ORAL
  Filled 2022-06-04 (×2): qty 30

## 2022-06-04 MED ORDER — OXYTOCIN-SODIUM CHLORIDE 30-0.9 UT/500ML-% IV SOLN: 2.5000 [IU]/h | INTRAVENOUS | Status: DC

## 2022-06-04 MED ORDER — FENTANYL CITRATE (PF) 100 MCG/2ML IJ SOLN
100.0000 ug | INTRAMUSCULAR | Status: DC | PRN
  Administered 2022-06-04 – 2022-06-05 (×3): 100 ug via INTRAVENOUS
  Filled 2022-06-04 (×3): qty 2

## 2022-06-04 MED ORDER — LACTATED RINGERS IV SOLN
500.0000 mL | INTRAVENOUS | Status: DC | PRN
  Administered 2022-06-04 (×2): 1000 mL via INTRAVENOUS
  Administered 2022-06-04 – 2022-06-05 (×2): 500 mL via INTRAVENOUS

## 2022-06-04 MED ORDER — OXYCODONE-ACETAMINOPHEN 5-325 MG PO TABS: 2.0000 | ORAL_TABLET | ORAL | Status: DC | PRN

## 2022-06-04 MED ORDER — OXYCODONE-ACETAMINOPHEN 5-325 MG PO TABS: 1.0000 | ORAL_TABLET | ORAL | Status: DC | PRN

## 2022-06-04 MED ORDER — OXYTOCIN BOLUS FROM INFUSION: 333.0000 mL | Freq: Once | INTRAVENOUS | Status: DC

## 2022-06-04 NOTE — Progress Notes (Signed)
Labor Progress Note Cassandra Avery is a 37 y.o. G2P1001 at [redacted]w[redacted]d presented for IOL due to [facility] S: doing well, feeling intermittent cramps with contractions.  O:  BP 124/67   Pulse 78   Temp 98.3 F (36.8 C) (Oral)   Resp 17   Ht 5\' 2"  (1.575 m)   Wt 106.6 kg   LMP 09/04/2021 (Exact Date)   BMI 42.98 kg/m  EFM: 135bpm /moderate /+accels, no decels  Contractions: every 1-3 mins. mild  CVE: Dilation: 2 Effacement (%): 50 Cervical Position: Posterior Station: -3 Presentation: Vertex Exam by:: MD Ndulae   A&P: 37 y.o. G2P1001 [redacted]w[redacted]d IOL for LGA baby #Labor: Progressing well. S/p cytotec X 2 rounds. Patient consented for FB, placed with 60cc successfully. Noted to have mild contractions, but frequency varies from 1-3 ;mins, will give a 1litre LR bolus, and if contractions space reasonably, plan to redose with cytotec, vs starting pitocin, low dose. #Pain: IV fentanyl, family/friend support, epidural when more active #FWB: Cat 1 #GBS negative  [name] MD MPH OB Fellow, [place] for [place] 06/04/2022

## 2022-06-04 NOTE — Progress Notes (Signed)
Ultrasound by charge nurse at bedside to confirm vertex presentation. Fetus is vertex.

## 2022-06-04 NOTE — H&P (Signed)
OBSTETRIC ADMISSION HISTORY AND PHYSICAL  Cassandra Avery is a 37 y.o. female G2P1001 with IUP at 48w0dby LMP presenting for IOL for LGA (per MFM). She reports +FMs, No LOF, no VB, no blurry vision, headaches or peripheral edema, and RUQ pain.  She plans on breast and formula feeding. She request nothing for birth control. She received her prenatal care at [facility]  Dating: By LMP --->  Estimated Date of Delivery: 06/11/22  Sono:  @[redacted]w[redacted]d$ , CWD, normal anatomy, cephalic presentation, anterior placenta, 3466g, 92% EFW  Prenatal History/Complications: history of gHTN (on aspirin), LGA w/o GDM  Nursing Staff Provider  Office Location  [facility] Dating  LMP c/w early [place] PNC Model [Valu.Nieves] Traditional [ ]$  Centering [ ]$  Mom-Baby Dyad    Language  English Anatomy [place] normal  Flu Vaccine  Declined 11/15 Genetic/Carrier Screen  NIPS:   Low risks AFP: neg   Horizon: 2 copies of SMN1 gene  TDaP Vaccine   Declined 11/15 Hgb A1C or  GTT Early  Third trimester 2 hour wnl 76, 126, 113  COVID Vaccine Not vaccinated   LAB RESULTS   Rhogam   Blood Type O/Positive/-- (07/18 1046)   Baby Feeding Plan Both Antibody Negative (07/18 1046)  Contraception Declined Rubella 5.87 (07/18 1046)  Circumcision Yes if a boy RPR Non Reactive (11/15 1118)   Pediatrician  [facility] HBsAg Negative (07/18 1046)   Support Person Husband HCVAb neg  Prenatal Classes  HIV Non Reactive (11/15 1118)     BTL Consent  GBS  negative (For PCN allergy, check sensitivities)   VBAC Consent  Pap Negative 11/23/21       DME Rx [Valu.Nieves] BP cuff [Valu.Nieves] Weight Scale Waterbirth  [ ]$  Class [ ]$  Consent [ ]$  CNM visit  PHQ9 & GAD7 [ X ] new OB [ X ] 28 weeks  [ X ] 36 weeks Induction  [ ]$  Orders Entered [ ]$ Foley Y/N   Past Medical History: Past Medical History:  Diagnosis Date   Gestational hypertension 01/26/2017   Headache    Recurrent upper respiratory infection (URI)    Past Surgical History: Past Surgical History:  Procedure  Laterality Date   NO PAST SURGERIES     Obstetrical History: OB History     Gravida  2   Para  1   Term  1   Preterm  0   AB  0   Living  1      SAB  0   IAB  0   Ectopic  0   Multiple      Live Births  1          Social History Social History   Socioeconomic History   Marital status: Married    Spouse name: Not on file   Number of children: Not on file   Years of education: Not on file   Highest education level: Not on file  Occupational History   Not on file  Tobacco Use   Smoking status: Never   Smokeless tobacco: Never  Vaping Use   Vaping Use: Never used  Substance and Sexual Activity   Alcohol use: Not Currently    Comment: socially, prior to pregnancy   Drug use: No   Sexual activity: Not Currently    Birth control/protection: None    Comment: pregnant  Other Topics Concern   Not on file  Social History Narrative   ** Merged History Encounter **  Social Determinants of Health   Financial Resource Strain: Not on file  Food Insecurity: No Food Insecurity (06/04/2022)   Hunger Vital Sign    Worried About Running Out of Food in the Last Year: Never true    Ran Out of Food in the Last Year: Never true  Transportation Needs: No Transportation Needs (06/04/2022)   PRAPARE - Hydrologist (Medical): No    Lack of Transportation (Non-Medical): No  Physical Activity: Not on file  Stress: Not on file  Social Connections: Not on file   Family History: Family History  Problem Relation Age of Onset   Miscarriages / Stillbirths Mother    Stroke Father    Asthma Sister    Stroke Paternal Grandmother    Hearing loss Paternal Grandfather    Allergies: No Known Allergies  Medications Prior to Admission  Medication Sig Dispense Refill Last Dose   aspirin EC 81 MG tablet Take 1 tablet (81 mg total) by mouth daily. Take after 12 weeks for prevention of preeclampsia later in pregnancy 300 tablet 2 Past Week at  0800   pantoprazole (PROTONIX) 20 MG tablet Take 1 tablet (20 mg total) by mouth 2 (two) times daily. 30 tablet 1 Past Week at 0800   Prenatal MV-Min-FA-Omega-3 (PRENATAL GUMMIES/DHA & FA) 0.4-32.5 MG CHEW Chew 2 each by mouth at bedtime.   Past Week at 0800   Blood Pressure Monitoring (BLOOD PRESSURE KIT) DEVI 1 kit by Does not apply route once a week. 1 each 0    ferrous sulfate (FERROUSUL) 325 (65 FE) MG tablet Take 1 tablet (325 mg total) by mouth every other day. 30 tablet 5  at 0800   Review of Systems  All systems reviewed and negative except as stated in HPI  Blood pressure 122/81, pulse 93, temperature 98.5 F (36.9 C), temperature source Oral, resp. rate 18, height 5' 2"$  (1.575 m), weight 235 lb (106.6 kg), last menstrual period 09/04/2021, currently breastfeeding. General appearance: alert, cooperative, appears stated age, and no distress Lungs: clear to auscultation bilaterally Heart: regular rate and rhythm Abdomen: soft, non-tender; bowel sounds normal Pelvic: normal external female genitalia, copious clear fluid on cervical exam Extremities: Homans sign is negative, no sign of DVT DTR's normal Presentation: cephalic Fetal monitoring: Baseline: 135 bpm, Variability: Good {> 6 bpm), Accelerations: Reactive, and Decelerations: Absent Uterine activity: UI Dilation: 1 Effacement (%): 50 Station: -3 Exam by:: Wallie Renshaw RN  Prenatal labs: ABO, Rh: --/--/O POS (01/27 0813) Antibody: NEG (01/27 0813) Rubella: 5.87 (07/18 1046) RPR: Non Reactive (11/15 1118)  HBsAg: Negative (07/18 1046)  HIV: Non Reactive (11/15 1118)  GBS: Negative/-- (01/09 0954)  2hr GTT: normal (76/126/113) Genetic screening: increased risk SMA Anatomy U/S: normal  Prenatal Transfer Tool  Maternal Diabetes: No Genetic Screening: Abnormal:  Results: Other: increased risk SMA Maternal Ultrasounds/Referrals: Normal Fetal Ultrasounds or other Referrals:  Referred to Materal Fetal Medicine  Maternal  Substance Abuse:  No Significant Maternal Medications:  None Significant Maternal Lab Results:  Group B Strep negative Number of Prenatal Visits:greater than 3 verified prenatal visits Other Comments:  None  Results for orders placed or performed during the hospital encounter of 06/04/22 (from the past 24 hour(s))  CBC   Collection Time: 06/04/22  8:09 AM  Result Value Ref Range   WBC 7.1 4.0 - 10.5 K/uL   RBC 3.72 (L) 3.87 - 5.11 MIL/uL   Hemoglobin 9.5 (L) 12.0 - 15.0 g/dL   HCT 31.4 (  L) 36.0 - 46.0 %   MCV 84.4 80.0 - 100.0 fL   MCH 25.5 (L) 26.0 - 34.0 pg   MCHC 30.3 30.0 - 36.0 g/dL   RDW 14.6 11.5 - 15.5 %   Platelets 285 150 - 400 K/uL   nRBC 0.0 0.0 - 0.2 %  Type and screen   Collection Time: 06/04/22  8:13 AM  Result Value Ref Range   ABO/RH(D) O POS    Antibody Screen NEG    Sample Expiration      06/07/2022,2359 Performed at [facility], [place] [address] [place], [place] [address]    Patient Active Problem List   Diagnosis Date Noted   BMI 40.0-44.9, adult ([place]) 05/17/2022   History of gestational hypertension 11/23/2021   Supervision of other normal pregnancy, antepartum 11/02/2021   Assessment/Plan:  Cassandra Avery is a 37 y.o. G2P1001 at 54w0dhere for IOL for LGA (per MFM)  #Labor: Will begin IOL with loading dose of oral+vaginal cytotec, ambulation encouraged #Pain: Planning epidural #FWB: Cat 1 #ID:  GBS neg #MOF: breast and formula #MOC: Declined #Circ:  Yes  JGabriel Carina CNM  06/04/2022, 10:20 AM

## 2022-06-04 NOTE — Plan of Care (Signed)

## 2022-06-04 NOTE — Progress Notes (Signed)
Patient ID: Cassandra Avery, female   DOB: [birth year], 37 y.o.   MRN: [id number]  Pt has been mostly resting with some ambulation in the room. Cervix a little more open, will repeat cytotec dose and encourage additional ambulation/movement. She is contracting a little more regularly at q1-60min. Will assess for Pitocin/AROM at next exam, if still the same will suggest foley balloon.  Gaylan Gerold, CNM, MSN, [place] Certified Nurse Midwife, [facility]

## 2022-06-05 ENCOUNTER — Other Ambulatory Visit: Payer: Self-pay

## 2022-06-05 ENCOUNTER — Encounter (HOSPITAL_COMMUNITY): Payer: Self-pay | Admitting: Obstetrics & Gynecology

## 2022-06-05 ENCOUNTER — Encounter (HOSPITAL_COMMUNITY)
Admission: RE | Disposition: A | Discharge status: Home / Self Care | Payer: Self-pay | Source: Home / Self Care | Attending: Obstetrics & Gynecology

## 2022-06-05 ENCOUNTER — Inpatient Hospital Stay (HOSPITAL_COMMUNITY): Payer: Managed Care, Other (non HMO) | Admitting: Anesthesiology

## 2022-06-05 DIAGNOSIS — O134 Gestational [pregnancy-induced] hypertension without significant proteinuria, complicating childbirth: Secondary | ICD-10-CM

## 2022-06-05 DIAGNOSIS — O99214 Obesity complicating childbirth: Secondary | ICD-10-CM

## 2022-06-05 DIAGNOSIS — O164 Unspecified maternal hypertension, complicating childbirth: Secondary | ICD-10-CM

## 2022-06-05 DIAGNOSIS — Z3A39 39 weeks gestation of pregnancy: Secondary | ICD-10-CM

## 2022-06-05 DIAGNOSIS — Z98891 History of uterine scar from previous surgery: Secondary | ICD-10-CM

## 2022-06-05 DIAGNOSIS — O3663X0 Maternal care for excessive fetal growth, third trimester, not applicable or unspecified: Secondary | ICD-10-CM

## 2022-06-05 SURGERY — Surgical Case
Anesthesia: Epidural

## 2022-06-05 MED ORDER — NALOXONE HCL 0.4 MG/ML IJ SOLN: 0.4000 mg | INTRAMUSCULAR | Status: DC | PRN

## 2022-06-05 MED ORDER — BUPIVACAINE HCL (PF) 0.25 % IJ SOLN
INTRAMUSCULAR | Status: DC | PRN
  Administered 2022-06-05 (×2): 3 mL via EPIDURAL

## 2022-06-05 MED ORDER — PHENYLEPHRINE 80 MCG/ML (10ML) SYRINGE FOR IV PUSH (FOR BLOOD PRESSURE SUPPORT)
80.0000 ug | PREFILLED_SYRINGE | INTRAVENOUS | Status: DC | PRN
  Administered 2022-06-05 (×2): 80 ug via INTRAVENOUS
  Filled 2022-06-05: qty 10

## 2022-06-05 MED ORDER — ENOXAPARIN SODIUM 60 MG/0.6ML IJ SOSY
50.0000 mg | PREFILLED_SYRINGE | INTRAMUSCULAR | Status: DC
  Administered 2022-06-06 – 2022-06-07 (×2): 50 mg via SUBCUTANEOUS
  Filled 2022-06-05 (×2): qty 0.6

## 2022-06-05 MED ORDER — KETOROLAC TROMETHAMINE 30 MG/ML IJ SOLN: 30.0000 mg | Freq: Once | INTRAMUSCULAR | Status: DC | PRN

## 2022-06-05 MED ORDER — PHENYLEPHRINE 80 MCG/ML (10ML) SYRINGE FOR IV PUSH (FOR BLOOD PRESSURE SUPPORT)
80.0000 ug | PREFILLED_SYRINGE | INTRAVENOUS | Status: DC | PRN
  Filled 2022-06-05: qty 10

## 2022-06-05 MED ORDER — ONDANSETRON HCL 4 MG/2ML IJ SOLN
INTRAMUSCULAR | Status: DC | PRN
  Administered 2022-06-05: 4 mg via INTRAVENOUS

## 2022-06-05 MED ORDER — PHENYLEPHRINE 80 MCG/ML (10ML) SYRINGE FOR IV PUSH (FOR BLOOD PRESSURE SUPPORT)
PREFILLED_SYRINGE | INTRAVENOUS | Status: AC
  Filled 2022-06-05: qty 10

## 2022-06-05 MED ORDER — SCOPOLAMINE 1 MG/3DAYS TD PT72: 1.0000 | MEDICATED_PATCH | Freq: Once | TRANSDERMAL | Status: DC

## 2022-06-05 MED ORDER — SCOPOLAMINE 1 MG/3DAYS TD PT72
MEDICATED_PATCH | TRANSDERMAL | Status: DC | PRN
  Administered 2022-06-05: 1 via TRANSDERMAL

## 2022-06-05 MED ORDER — OXYTOCIN-SODIUM CHLORIDE 30-0.9 UT/500ML-% IV SOLN
INTRAVENOUS | Status: AC
  Filled 2022-06-05: qty 500

## 2022-06-05 MED ORDER — TERBUTALINE SULFATE 1 MG/ML IJ SOLN
0.2500 mg | Freq: Once | INTRAMUSCULAR | Status: AC | PRN
  Administered 2022-06-05: 0.25 mg via SUBCUTANEOUS

## 2022-06-05 MED ORDER — EPHEDRINE 5 MG/ML INJ: 10.0000 mg | INTRAVENOUS | Status: DC | PRN

## 2022-06-05 MED ORDER — ACETAMINOPHEN 500 MG PO TABS
1000.0000 mg | ORAL_TABLET | Freq: Four times a day (QID) | ORAL | Status: DC
  Administered 2022-06-05 – 2022-06-07 (×6): 1000 mg via ORAL
  Filled 2022-06-05 (×6): qty 2

## 2022-06-05 MED ORDER — SIMETHICONE 80 MG PO CHEW
80.0000 mg | CHEWABLE_TABLET | Freq: Three times a day (TID) | ORAL | Status: DC
  Administered 2022-06-05 – 2022-06-07 (×5): 80 mg via ORAL
  Filled 2022-06-05 (×5): qty 1

## 2022-06-05 MED ORDER — LIDOCAINE-EPINEPHRINE (PF) 2 %-1:200000 IJ SOLN
INTRAMUSCULAR | Status: DC | PRN
  Administered 2022-06-05: 3 mL via EPIDURAL
  Administered 2022-06-05: 5 mL via EPIDURAL

## 2022-06-05 MED ORDER — LACTATED RINGERS IV SOLN: INTRAVENOUS | Status: DC | PRN

## 2022-06-05 MED ORDER — ONDANSETRON HCL 4 MG/2ML IJ SOLN
INTRAMUSCULAR | Status: AC
  Filled 2022-06-05: qty 2

## 2022-06-05 MED ORDER — CEFAZOLIN SODIUM-DEXTROSE 2-4 GM/100ML-% IV SOLN
INTRAVENOUS | Status: AC
  Filled 2022-06-05: qty 100

## 2022-06-05 MED ORDER — DEXAMETHASONE SODIUM PHOSPHATE 4 MG/ML IJ SOLN
INTRAMUSCULAR | Status: DC | PRN
  Administered 2022-06-05: 8 mg via INTRAVENOUS

## 2022-06-05 MED ORDER — DIPHENHYDRAMINE HCL 25 MG PO CAPS: 25.0000 mg | ORAL_CAPSULE | Freq: Four times a day (QID) | ORAL | Status: DC | PRN

## 2022-06-05 MED ORDER — IBUPROFEN 600 MG PO TABS
600.0000 mg | ORAL_TABLET | Freq: Four times a day (QID) | ORAL | Status: DC
  Administered 2022-06-06 – 2022-06-07 (×3): 600 mg via ORAL
  Filled 2022-06-05 (×3): qty 1

## 2022-06-05 MED ORDER — KETOROLAC TROMETHAMINE 30 MG/ML IJ SOLN
30.0000 mg | Freq: Four times a day (QID) | INTRAMUSCULAR | Status: AC
  Administered 2022-06-05 – 2022-06-06 (×4): 30 mg via INTRAVENOUS
  Filled 2022-06-05 (×4): qty 1

## 2022-06-05 MED ORDER — NALOXONE HCL 4 MG/10ML IJ SOLN: 1.0000 ug/kg/h | INTRAVENOUS | Status: DC | PRN

## 2022-06-05 MED ORDER — SIMETHICONE 80 MG PO CHEW: 80.0000 mg | CHEWABLE_TABLET | ORAL | Status: DC | PRN

## 2022-06-05 MED ORDER — OXYTOCIN-SODIUM CHLORIDE 30-0.9 UT/500ML-% IV SOLN
INTRAVENOUS | Status: DC | PRN
  Administered 2022-06-05: 450 mL via INTRAVENOUS

## 2022-06-05 MED ORDER — OXYTOCIN-SODIUM CHLORIDE 30-0.9 UT/500ML-% IV SOLN
2.5000 [IU]/h | INTRAVENOUS | Status: AC
  Administered 2022-06-05: 2.5 [IU]/h via INTRAVENOUS
  Filled 2022-06-05: qty 500

## 2022-06-05 MED ORDER — MEPERIDINE HCL 25 MG/ML IJ SOLN: 6.2500 mg | INTRAMUSCULAR | Status: DC | PRN

## 2022-06-05 MED ORDER — METOCLOPRAMIDE HCL 5 MG/ML IJ SOLN
INTRAMUSCULAR | Status: DC | PRN
  Administered 2022-06-05: 10 mg via INTRAVENOUS

## 2022-06-05 MED ORDER — PHENYLEPHRINE HCL (PRESSORS) 10 MG/ML IV SOLN
INTRAVENOUS | Status: DC | PRN
  Administered 2022-06-05 (×5): 160 ug via INTRAVENOUS
  Administered 2022-06-05: 80 ug via INTRAVENOUS

## 2022-06-05 MED ORDER — GABAPENTIN 100 MG PO CAPS
100.0000 mg | ORAL_CAPSULE | Freq: Two times a day (BID) | ORAL | Status: DC
  Administered 2022-06-05 – 2022-06-07 (×4): 100 mg via ORAL
  Filled 2022-06-05 (×4): qty 1

## 2022-06-05 MED ORDER — CEFAZOLIN SODIUM-DEXTROSE 2-4 GM/100ML-% IV SOLN
2.0000 g | Freq: Once | INTRAVENOUS | Status: AC
  Administered 2022-06-05: 2 g via INTRAVENOUS

## 2022-06-05 MED ORDER — ONDANSETRON HCL 4 MG/2ML IJ SOLN: 4.0000 mg | Freq: Three times a day (TID) | INTRAMUSCULAR | Status: DC | PRN

## 2022-06-05 MED ORDER — SODIUM BICARBONATE 8.4 % IV SOLN
INTRAVENOUS | Status: DC | PRN
  Administered 2022-06-05 (×2): 5 mL via EPIDURAL

## 2022-06-05 MED ORDER — PRENATAL MULTIVITAMIN CH
1.0000 | ORAL_TABLET | Freq: Every day | ORAL | Status: DC
  Administered 2022-06-06: 1 via ORAL
  Filled 2022-06-05: qty 1

## 2022-06-05 MED ORDER — OXYTOCIN-SODIUM CHLORIDE 30-0.9 UT/500ML-% IV SOLN
1.0000 m[IU]/min | INTRAVENOUS | Status: DC
  Administered 2022-06-05: 2 m[IU]/min via INTRAVENOUS
  Filled 2022-06-05: qty 500

## 2022-06-05 MED ORDER — DEXAMETHASONE SODIUM PHOSPHATE 4 MG/ML IJ SOLN
INTRAMUSCULAR | Status: AC
  Filled 2022-06-05: qty 2

## 2022-06-05 MED ORDER — FENTANYL CITRATE (PF) 100 MCG/2ML IJ SOLN
INTRAMUSCULAR | Status: DC | PRN
  Administered 2022-06-05: 100 ug via EPIDURAL

## 2022-06-05 MED ORDER — FENTANYL-BUPIVACAINE-NACL 0.5-0.125-0.9 MG/250ML-% EP SOLN
12.0000 mL/h | EPIDURAL | Status: DC | PRN
  Administered 2022-06-05: 12 mL/h via EPIDURAL
  Filled 2022-06-05: qty 250

## 2022-06-05 MED ORDER — WITCH HAZEL-GLYCERIN EX PADS: 1.0000 | MEDICATED_PAD | CUTANEOUS | Status: DC | PRN

## 2022-06-05 MED ORDER — FENTANYL CITRATE (PF) 100 MCG/2ML IJ SOLN
INTRAMUSCULAR | Status: AC
  Filled 2022-06-05: qty 2

## 2022-06-05 MED ORDER — DIPHENHYDRAMINE HCL 25 MG PO CAPS: 25.0000 mg | ORAL_CAPSULE | ORAL | Status: DC | PRN

## 2022-06-05 MED ORDER — DIBUCAINE (PERIANAL) 1 % EX OINT: 1.0000 | TOPICAL_OINTMENT | CUTANEOUS | Status: DC | PRN

## 2022-06-05 MED ORDER — METOCLOPRAMIDE HCL 5 MG/ML IJ SOLN
INTRAMUSCULAR | Status: AC
  Filled 2022-06-05: qty 2

## 2022-06-05 MED ORDER — DIPHENHYDRAMINE HCL 50 MG/ML IJ SOLN: 12.5000 mg | INTRAMUSCULAR | Status: DC | PRN

## 2022-06-05 MED ORDER — SODIUM CHLORIDE 0.9% FLUSH: 3.0000 mL | INTRAVENOUS | Status: DC | PRN

## 2022-06-05 MED ORDER — LACTATED RINGERS AMNIOINFUSION: INTRAVENOUS | Status: DC

## 2022-06-05 MED ORDER — OXYCODONE HCL 5 MG PO TABS
5.0000 mg | ORAL_TABLET | ORAL | Status: DC | PRN
  Administered 2022-06-07: 10 mg via ORAL
  Filled 2022-06-05: qty 2

## 2022-06-05 MED ORDER — ZOLPIDEM TARTRATE 5 MG PO TABS: 5.0000 mg | ORAL_TABLET | Freq: Every evening | ORAL | Status: DC | PRN

## 2022-06-05 MED ORDER — SODIUM CHLORIDE 0.9 % IV SOLN
INTRAVENOUS | Status: AC
  Filled 2022-06-05: qty 5

## 2022-06-05 MED ORDER — SODIUM CHLORIDE 0.9 % IR SOLN
Status: DC | PRN
  Administered 2022-06-05: 1

## 2022-06-05 MED ORDER — FUROSEMIDE 20 MG PO TABS
20.0000 mg | ORAL_TABLET | Freq: Every day | ORAL | Status: DC
  Administered 2022-06-06 – 2022-06-07 (×2): 20 mg via ORAL
  Filled 2022-06-05 (×2): qty 1

## 2022-06-05 MED ORDER — BUPIVACAINE LIPOSOME 1.3 % IJ SUSP
INTRAMUSCULAR | Status: AC
  Filled 2022-06-05: qty 20

## 2022-06-05 MED ORDER — SCOPOLAMINE 1 MG/3DAYS TD PT72
MEDICATED_PATCH | TRANSDERMAL | Status: AC
  Filled 2022-06-05: qty 1

## 2022-06-05 MED ORDER — COCONUT OIL OIL: 1.0000 | TOPICAL_OIL | Status: DC | PRN

## 2022-06-05 MED ORDER — MORPHINE SULFATE (PF) 0.5 MG/ML IJ SOLN
INTRAMUSCULAR | Status: DC | PRN
  Administered 2022-06-05: 3 mg via EPIDURAL

## 2022-06-05 MED ORDER — FENTANYL CITRATE (PF) 100 MCG/2ML IJ SOLN
INTRAMUSCULAR | Status: DC | PRN
  Administered 2022-06-05: 25 ug via INTRAVENOUS

## 2022-06-05 MED ORDER — MENTHOL 3 MG MT LOZG: 1.0000 | LOZENGE | OROMUCOSAL | Status: DC | PRN

## 2022-06-05 MED ORDER — SENNOSIDES-DOCUSATE SODIUM 8.6-50 MG PO TABS
2.0000 | ORAL_TABLET | Freq: Every day | ORAL | Status: DC
  Administered 2022-06-06 – 2022-06-07 (×2): 2 via ORAL
  Filled 2022-06-05 (×2): qty 2

## 2022-06-05 MED ORDER — PHENYLEPHRINE HCL-NACL 20-0.9 MG/250ML-% IV SOLN
INTRAVENOUS | Status: AC
  Filled 2022-06-05: qty 250

## 2022-06-05 MED ORDER — LACTATED RINGERS IV SOLN
500.0000 mL | Freq: Once | INTRAVENOUS | Status: AC
  Administered 2022-06-05: 500 mL via INTRAVENOUS

## 2022-06-05 MED ORDER — TETANUS-DIPHTH-ACELL PERTUSSIS 5-2.5-18.5 LF-MCG/0.5 IM SUSY: 0.5000 mL | PREFILLED_SYRINGE | Freq: Once | INTRAMUSCULAR | Status: DC

## 2022-06-05 MED ORDER — SODIUM CHLORIDE 0.9 % IV SOLN
500.0000 mg | Freq: Once | INTRAVENOUS | Status: AC
  Administered 2022-06-05: 500 mg via INTRAVENOUS

## 2022-06-05 MED ORDER — MORPHINE SULFATE (PF) 0.5 MG/ML IJ SOLN
INTRAMUSCULAR | Status: AC
  Filled 2022-06-05: qty 10

## 2022-06-05 MED ORDER — STERILE WATER FOR IRRIGATION IR SOLN
Status: DC | PRN
  Administered 2022-06-05: 1

## 2022-06-05 SURGICAL SUPPLY — 40 items
BENZOIN TINCTURE PRP APPL 2/3 (GAUZE/BANDAGES/DRESSINGS) IMPLANT
CHLORAPREP W/TINT 26 (MISCELLANEOUS) ×2 IMPLANT
CLAMP UMBILICAL CORD (MISCELLANEOUS) ×1 IMPLANT
CLOTH BEACON ORANGE TIMEOUT ST (SAFETY) ×1 IMPLANT
DERMABOND ADVANCED .7 DNX12 (GAUZE/BANDAGES/DRESSINGS) ×2 IMPLANT
DRSG OPSITE POSTOP 4X10 (GAUZE/BANDAGES/DRESSINGS) ×1 IMPLANT
ELECT REM PT RETURN 9FT ADLT (ELECTROSURGICAL) ×1
ELECTRODE REM PT RTRN 9FT ADLT (ELECTROSURGICAL) ×1 IMPLANT
EXTRACTOR VACUUM BELL STYLE (SUCTIONS) IMPLANT
GAUZE SPONGE 4X4 12PLY STRL LF (GAUZE/BANDAGES/DRESSINGS) IMPLANT
GLOVE BIOGEL PI IND STRL 7.0 (GLOVE) ×1 IMPLANT
GLOVE BIOGEL PI IND STRL 8 (GLOVE) ×1 IMPLANT
GLOVE ECLIPSE 8.0 STRL XLNG CF (GLOVE) ×1 IMPLANT
GOWN STRL REUS W/TWL LRG LVL3 (GOWN DISPOSABLE) ×2 IMPLANT
KIT ABG SYR 3ML LUER SLIP (SYRINGE) ×1 IMPLANT
NDL HYPO 18GX1.5 BLUNT FILL (NEEDLE) ×1 IMPLANT
NDL HYPO 25X5/8 SAFETYGLIDE (NEEDLE) ×1 IMPLANT
NEEDLE HYPO 18GX1.5 BLUNT FILL (NEEDLE) ×1 IMPLANT
NEEDLE HYPO 22GX1.5 SAFETY (NEEDLE) ×1 IMPLANT
NEEDLE HYPO 25X5/8 SAFETYGLIDE (NEEDLE) ×1 IMPLANT
NS IRRIG 1000ML POUR BTL (IV SOLUTION) ×1 IMPLANT
PACK C SECTION WH (CUSTOM PROCEDURE TRAY) ×1 IMPLANT
PAD ABD 7.5X8 STRL (GAUZE/BANDAGES/DRESSINGS) IMPLANT
PAD OB MATERNITY 4.3X12.25 (PERSONAL CARE ITEMS) ×1 IMPLANT
PENCIL SMOKE EVACUATOR (MISCELLANEOUS) IMPLANT
RETRACTOR TRAXI PANNICULUS (MISCELLANEOUS) IMPLANT
RTRCTR C-SECT PINK 25CM LRG (MISCELLANEOUS) IMPLANT
STRIP CLOSURE SKIN 1/2X4 (GAUZE/BANDAGES/DRESSINGS) IMPLANT
SUT CHROMIC 0 CT 1 (SUTURE) ×1 IMPLANT
SUT MNCRL 0 VIOLET CTX 36 (SUTURE) ×2 IMPLANT
SUT MONOCRYL 0 CTX 36 (SUTURE) ×2
SUT PLAIN 2 0 (SUTURE)
SUT PLAIN 2 0 XLH (SUTURE) IMPLANT
SUT PLAIN ABS 2-0 CT1 27XMFL (SUTURE) IMPLANT
SUT VIC AB 0 CTX 36 (SUTURE) ×1
SUT VIC AB 0 CTX36XBRD ANBCTRL (SUTURE) ×1 IMPLANT
SUT VIC AB 4-0 KS 27 (SUTURE) IMPLANT
TOWEL OR 17X24 6PK STRL BLUE (TOWEL DISPOSABLE) ×1 IMPLANT
TRAY FOLEY W/BAG SLVR 14FR LF (SET/KITS/TRAYS/PACK) IMPLANT
WATER STERILE IRR 1000ML POUR (IV SOLUTION) ×1 IMPLANT

## 2022-06-05 NOTE — Lactation Note (Signed)
This note was copied from a baby's chart. Lactation Consultation Note  Patient Name: Cassandra Avery [id number] Date: 06/05/2022 Reason for consult: Initial assessment;Term Age:35 hours  P2, 39.[redacted] weeks gestation, C/S  Infant has been sleeping according to mother and has not latched since birth. Baby Cassandra "Cassandra Avery" started to show cues and mother assisted with latch in football hold. Baby is feeding well with occasional swallows. Hand expression taught to mother prior to latch and drops of colostrum was expressed.   Mother wants to breast and formula feed, adding formula as needed before returning to work. She breast fed her other child for 3 months, requiring formula due to low milk supply.   Instructed to observe baby for feeding cues, skin to skin, call for assistance with latching baby as needed. Mother alone in her room, advised if feeling sleepy when breast feeding to call RN for help to remove infant to crib. Mother is currently alert and talkative.   OP LC services and support handout given.  *A bottle of donor breast milk was made for infant's feeding most likely when baby was anticipated to go to NICU after birth. Infant stayed with mother. Mother states she plans to breast feed and supplement but does not want to give donor breast milk. Preference:formula     Maternal Data Has patient been taught Hand Expression?: Yes Does the patient have breastfeeding experience prior to this delivery?: Yes How long did the patient breastfeed?: 3 months  Feeding Mother's Current Feeding Choice: Breast Milk and Formula  LATCH Score Latch: Grasps breast easily, tongue down, lips flanged, rhythmical sucking.  Audible Swallowing: A few with stimulation  Type of Nipple: Everted at rest and after stimulation  Comfort (Breast/Nipple): Soft / non-tender  Hold (Positioning): Full assist, staff holds infant at breast  LATCH Score: 7  Interventions Interventions: Breast feeding basics  reviewed;Assisted with latch;Skin to skin;Hand express;Breast compression;Adjust position;Support pillows;Position options;Education;LC Services brochure  Discharge Pump: DEBP;Hands Free  Consult Status Consult Status: Follow-up Date: 06/06/22 Follow-up type: In-patient   Stana Bunting M 06/05/2022, 6:37 PM

## 2022-06-05 NOTE — Anesthesia Procedure Notes (Signed)
Epidural Patient location during procedure: OB Start time: 06/05/2022 5:44 AM End time: 06/05/2022 6:01 AM  Staffing Anesthesiologist: Oleta Mouse, MD Performed: anesthesiologist   Preanesthetic Checklist Completed: patient identified, IV checked, risks and benefits discussed, monitors and equipment checked, pre-op evaluation and timeout performed  Epidural Patient position: sitting Prep: DuraPrep Patient monitoring: heart rate, continuous pulse ox and blood pressure Approach: midline Location: L4-L5 Injection technique: LOR saline  Needle:  Needle type: Tuohy  Needle gauge: 17 G Needle length: 9 cm Needle insertion depth: 8 cm Catheter type: closed end flexible Catheter size: 19 Gauge Catheter at skin depth: 14 cm Test dose: negative and 2% lidocaine with Epi 1:200 K  Assessment Events: blood not aspirated, no cerebrospinal fluid, injection not painful, no injection resistance, no paresthesia and negative IV test

## 2022-06-05 NOTE — Op Note (Signed)
Cassandra Avery PROCEDURE DATE: 06/05/2022  PREOPERATIVE DIAGNOSES: Intrauterine pregnancy at 72w1dweeks gestation; non-reassuring fetal status  POSTOPERATIVE DIAGNOSES: The same  PROCEDURE: Low Transverse Cesarean Section  SURGEON:  Dr. EElonda Husky ASSISTANT:  Dr. AJanus Molder An experienced assistant was required given the standard of surgical care given the complexity of the case.  This assistant was needed for exposure, dissection, suctioning, retraction, instrument exchange, assisting with delivery with administration of fundal pressure, and for overall help during the procedure.  ANESTHESIOLOGY TEAM: Anesthesiologist: MOleta Mouse MD; Stoltzfus, GMarch Rummage DO CRNA: FFlossie Dibble CRNA  INDICATIONS: [name] a 37y.o. G2P1001 at 369w1dere for cesarean section secondary to the indications listed under preoperative diagnoses; please see preoperative note for further details.  The risks of surgery were discussed with the patient including but were not limited to: bleeding which may require transfusion or reoperation; infection which may require antibiotics; injury to bowel, bladder, ureters or other surrounding organs; injury to the fetus; need for additional procedures including hysterectomy in the event of a life-threatening hemorrhage; formation of adhesions; placental abnormalities wth subsequent pregnancies; incisional problems; thromboembolic phenomenon and other postoperative/anesthesia complications.  The patient concurred with the proposed plan, giving informed written consent for the procedure.    FINDINGS:  Viable female infant in cephalic presentation.  Apgars 3 and 9.  Clear amniotic fluid.  Intact placenta, three vessel cord.  Normal uterus, fallopian tubes and ovaries bilaterally.  ANESTHESIA: Epidural  INTRAVENOUS FLUIDS: 1000 ml   ESTIMATED BLOOD LOSS: 802 ml URINE OUTPUT:  100 ml SPECIMENS: Placenta sent to L&D COMPLICATIONS: None immediate  PROCEDURE IN DETAIL:  The  patient preoperatively received intravenous antibiotics and had sequential compression devices applied to her lower extremities.  She was then taken to the operating room where the epidural anesthesia was dosed up to surgical level and was found to be adequate. She was then placed in a dorsal supine position with a leftward tilt, and prepped and draped in a sterile manner.  A foley catheter was in place and attached to constant gravity.  After an adequate timeout was performed, a Pfannenstiel skin incision was made with scalpel and carried through to the underlying layer of fascia. The fascia was incised in the midline, and this incision was extended bilaterally in a blunt fashion.  The underlying rectus muscles were dissected off the fascia superiorly and inferiorly in a blunt fashion. The rectus muscles were separated in the midline and the peritoneum was entered bluntly. The Alexis self-retaining retractor was introduced into the abdominal cavity.  Attention was turned to the lower uterine segment where a low transverse hysterotomy was made with a scalpel and extended bilaterally bluntly.  The infant was successfully delivered, the cord was clamped and cut after one minute, and the infant was handed over to the awaiting neonatology team. Cord gas was collected and was 7.16. Uterine massage was then administered, and the placenta delivered intact with a three-vessel cord. The uterus was then cleared of clots and debris.  The hysterotomy was closed with 0 Vicryl in a running locked fashion.  The pelvis was cleared of all clot and debris. Hemostasis was confirmed on all surfaces.  The retractor was removed.  The peritoneum was closed with a 0 Vicryl running stitch. The fascia was then closed using 0 Vicryl in a running fashion.  The subcutaneous layer was irrigated and the skin was closed with a 4-0 Vicryl subcuticular stitch. The patient tolerated the procedure well. Sponge, instrument and needle  counts were  correct x 3.  She was taken to the recovery room in stable condition.   Cassandra Fee, DO OB Fellow, [place] for [place] 06/05/2022, 2:50 PM

## 2022-06-05 NOTE — Progress Notes (Signed)
Patient c/o increased pain to right lower abdomen not relieved with several epidural dosing. Patient repositioned to slight right tilt with hip roll and HOB at a 30 degree angle. Adequate fluid return with amnioinfusion.

## 2022-06-05 NOTE — Progress Notes (Addendum)
Labor Progress Note Cassandra Avery is a 37 y.o. G2P1001 at 103w0d presented for IOL due to LGA  S: No acute maternal concerns. Fetal concern of repetitive lates now with prolonged decels.   O:  BP 132/73   Pulse 72   Temp 98.1 F (36.7 C)   Resp 16   Ht 5\' 2"  (1.575 m)   Wt 106.6 kg   LMP 09/04/2021 (Exact Date)   SpO2 98%   BMI 42.98 kg/m   EFM: 120bpm, moderate variability, +accels, recurrent late and prolonged decels  Contractions: every 2-3 mins.  CVE: Dilation: 5 Effacement (%): 60 Cervical Position: Posterior Station: -2 Presentation: Vertex Exam by:: Ndulue  A&P: 37 y.o. G2P1001 [redacted]w[redacted]d IOL for LGA baby #Labor: On 4 of pit which was stopped due to repetitive late and prolonged decels. IUPC and FSE placed amnio started. Low threshold for terb.  #Pain: Epidural #FWB: Cat 2 - repetitive late and prolonged decels. Pit off, IUPC placed with amnio. FSE placed. Side-lying on maternal left with good improvement and variability #GBS negative  Gerlene Fee, DO OB Fellow, [place] for [employer] 06/05/2022, 9:25 AM

## 2022-06-05 NOTE — Progress Notes (Signed)
Order received to give 80 mcg Phenylephrine for utero-placental perfusion improvement per Dr Lavonia Drafts.

## 2022-06-05 NOTE — Discharge Summary (Signed)
Postpartum Discharge Summary  Date of Service updated***     Patient Name: Cassandra Avery DOB: June 15, 1985 MRN: [id number]  Date of admission: 06/04/2022 Delivery date:06/05/2022  Delivering provider:   Date of discharge: 06/05/2022  Admitting diagnosis: Morbid obesity with BMI of 40.0-44.9, adult ([place]) [E66.01, Z68.41] Intrauterine pregnancy: [redacted]w[redacted]d     Secondary diagnosis:  Active Problems:   History of gestational hypertension   S/P primary low transverse C-section  Additional problems: ***    Discharge diagnosis: {DX.:23714}                                              Post partum procedures:{Postpartum procedures:23558} Augmentation: AROM, Pitocin, Cytotec, and IP Foley Complications: None  Hospital course: Induction of Labor With Cesarean Section   37 y.o. yo G2P2002 at [redacted]w[redacted]d was admitted to the hospital 06/04/2022 for induction of labor. Patient had a labor course significant for recurrent prolonged and late decels. The patient went for cesarean section due to Non-Reassuring FHR. Delivery details are as follows: Membrane Rupture Time/Date: 5:15 AM ,06/05/2022   Delivery Method:C-Section, Low Transverse  Details of operation can be found in separate operative Note.  Patient had a postpartum course complicated by***. She is ambulating, tolerating a regular diet, passing flatus, and urinating well.  Patient is discharged home in stable condition on 06/05/22.      Newborn Data: Birth date:06/05/2022  Birth time:1:10 PM  Gender:Female  Living status:Living  Apgars:3 ,9  Weight:3990 g                                Magnesium Sulfate received: {Mag received:30440022} BMZ received: {BMZ received:30440023} Rhophylac:{Rhophylac received:30440032} BZJ:{IRC:78938101} T-DaP:{Tdap:23962} Flu: {BPZ:02585} Transfusion:{Transfusion received:30440034}  Physical exam  Vitals:   06/05/22 1426 06/05/22 1427 06/05/22 1430 06/05/22 1445  BP:   (!) 141/72 135/77  Pulse: (!) 104 (!) 103 (!)  102 (!) 104  Resp: 15 18 17 20   Temp:   98.2 F (36.8 C)   TempSrc:      SpO2: 100% 100% 100% 100%  Weight:      Height:       General: {Exam; general:21111117} Lochia: {Desc; appropriate/inappropriate:30686::"appropriate"} Uterine Fundus: {Desc; firm/soft:30687} Incision: {Exam; incision:21111123} DVT Evaluation: {Exam; dvt:2111122} Labs: Lab Results  Component Value Date   WBC 7.1 06/04/2022   HGB 9.5 (L) 06/04/2022   HCT 31.4 (L) 06/04/2022   MCV 84.4 06/04/2022   PLT 285 06/04/2022      Latest Ref Rng & Units 05/30/2022    2:17 PM  CMP  Glucose 70 - 99 mg/dL 65   BUN 6 - 20 mg/dL <5   Creatinine 0.44 - 1.00 mg/dL 0.49   Sodium 135 - 145 mmol/L 135   Potassium 3.5 - 5.1 mmol/L 3.4   Chloride 98 - 111 mmol/L 105   CO2 22 - 32 mmol/L 20   Calcium 8.9 - 10.3 mg/dL 8.9   Total Protein 6.5 - 8.1 g/dL 5.8   Total Bilirubin 0.3 - 1.2 mg/dL 0.3   Alkaline Phos 38 - 126 U/L 131   AST 15 - 41 U/L 16   ALT 0 - 44 U/L 12    Edinburgh Score:    02/23/2017   10:45 AM  Edinburgh Postnatal Depression Scale Screening Tool  I have been able to laugh  and see the funny side of things. 0  I have looked forward with enjoyment to things. 0  I have blamed myself unnecessarily when things went wrong. 0  I have been anxious or worried for no good reason. 0  I have felt scared or panicky for no good reason. 0  Things have been getting on top of me. 0  I have been so unhappy that I have had difficulty sleeping. 0  I have felt sad or miserable. 0  I have been so unhappy that I have been crying. 0  The thought of harming myself has occurred to me. 0  Edinburgh Postnatal Depression Scale Total 0     After visit meds:  Allergies as of 06/05/2022   No Known Allergies   Med Rec must be completed prior to using this [facility]***        Discharge home in stable condition Infant Feeding: {Baby feeding:23562} Infant Disposition:{CHL IP OB HOME WITH DQQIWL:79892} Discharge  instruction: per After Visit Summary and Postpartum booklet. Activity: Advance as tolerated. Pelvic rest for 6 weeks.  Diet: {OB JJHE:17408144} Future Appointments: Future Appointments  Date Time Provider Department Center  06/07/2022  8:35 AM Griffin Basil, MD [facility] None   Follow up Visit:  Message sent to [facility] by Autry-Lott on 06/05/2022  Please schedule this patient for a In person postpartum visit in 6 weeks with the following provider: MD and APP. Additional Postpartum F/U:Incision check 1 week and BP check 1 week  High risk pregnancy complicated by:  gHTN Delivery mode:  C-Section, Low Transverse  Anticipated Birth Control:   Declined   06/05/2022 Simone Autry-Lott, DO

## 2022-06-05 NOTE — Progress Notes (Addendum)
Labor Progress Note Cassandra Avery is a 38 y.o. G2P1001 at [redacted]w[redacted]d presented for IOL due to [facility] S: doing well. Feeling pain with contractions. Has had a couple doses of fentanyl  O:  BP (!) 148/92 (BP Location: Right Arm)   Pulse (!) 105   Temp 98 F (36.7 C) (Oral)   Resp 17   Ht 5\' 2"  (1.575 m)   Wt 106.6 kg   LMP 09/04/2021 (Exact Date)   BMI 42.98 kg/m   EFM: 145bpm, moderate variability, +accels, intermittent early and late decels  Contractions: every 2-3 mins.  CVE: Dilation: 5 Effacement (%): 60 Cervical Position: Posterior Station: -2 Presentation: Vertex Exam by:: Jaylie Neaves   A&P: 37 y.o. G2P1001 [redacted]w[redacted]d IOL for LGA baby #Labor: Progressing well. S/p cytotec X 3 rounds and FB. Pit started at [place]. AROM performed with clear fluid.  #Pain: IV fentanyl, family/friend support, epidural when more active #FWB: Cat 2 - due to intermittent late decels. Has  moderate variability and spontaneous accels, so overall reassuring fetal heart tracing.  #GBS negative  Liliane Channel MD MPH OB Fellow, [place] for [place] 06/05/2022

## 2022-06-05 NOTE — Anesthesia Postprocedure Evaluation (Signed)
Anesthesia Post Note  Patient: Cassandra Avery  Procedure(s) Performed: CESAREAN SECTION     Patient location during evaluation: PACU Anesthesia Type: Epidural Level of consciousness: oriented and awake and alert Pain management: pain level controlled Vital Signs Assessment: post-procedure vital signs reviewed and stable Respiratory status: spontaneous breathing and respiratory function stable Cardiovascular status: blood pressure returned to baseline and stable Postop Assessment: no headache, no backache and no apparent nausea or vomiting Anesthetic complications: no   No notable events documented.  Last Vitals:  Vitals:   06/05/22 1530 06/05/22 1545  BP: 125/67 120/68  Pulse: (!) 107 84  Resp: 17 19  Temp:    SpO2: 100% 100%    Last Pain:  Vitals:   06/05/22 1545  TempSrc:   PainSc: 0-No pain   Pain Goal:                Epidural/Spinal Function Cutaneous sensation: Able to Discern Pressure (06/05/22 1515), Patient able to flex knees: No (06/05/22 1515), Patient able to lift hips off bed: No (06/05/22 1515), Back pain beyond tenderness at insertion site: No (06/05/22 1515), Progressively worsening motor and/or sensory loss: No (06/05/22 1515), Bowel and/or bladder incontinence post epidural: No (06/05/22 1515)  [place]

## 2022-06-05 NOTE — Progress Notes (Signed)
Report called to receiving nurse

## 2022-06-05 NOTE — Anesthesia Preprocedure Evaluation (Signed)
Anesthesia Evaluation  Patient identified by MRN, date of birth, ID band Patient awake    Reviewed: Allergy & Precautions, Patient's Chart, lab work & pertinent test results  History of Anesthesia Complications Negative for: history of anesthetic complications  Airway Mallampati: III  TM Distance: >3 FB Neck ROM: Full    Dental no notable dental hx.    Pulmonary neg pulmonary ROS   breath sounds clear to auscultation       Cardiovascular hypertension,  Rhythm:Regular     Neuro/Psych negative neurological ROS  negative psych ROS   GI/Hepatic negative GI ROS, Neg liver ROS,,,  Endo/Other  negative endocrine ROS    Renal/GU negative Renal ROS     Musculoskeletal   Abdominal   Peds  Hematology  (+) Blood dyscrasia, anemia Lab Results      Component                Value               Date                      WBC                      7.1                 06/04/2022                HGB                      9.5 (L)             06/04/2022                HCT                      31.4 (L)            06/04/2022                MCV                      84.4                06/04/2022                PLT                      285                 06/04/2022            Denies blood thinners   Anesthesia Other Findings   Reproductive/Obstetrics (+) Pregnancy                             Anesthesia Physical Anesthesia Plan  ASA: 2  Anesthesia Plan: Epidural   Post-op Pain Management:    Induction:   PONV Risk Score and Plan: 2 and Treatment may vary due to age or medical condition  Airway Management Planned: Natural Airway  Additional Equipment: None  Intra-op Plan:   Post-operative Plan:   Informed Consent: I have reviewed the patients History and Physical, chart, labs and discussed the procedure including the risks, benefits and alternatives for the proposed anesthesia with the patient or  authorized representative who has indicated his/her understanding and acceptance.  Plan Discussed with:   Anesthesia Plan Comments:        Anesthesia Quick Evaluation

## 2022-06-05 NOTE — Progress Notes (Signed)
S/p cytotec x3, FB, pit. Pit discontinued @0910 . Hx of recurrent late and prolonged decels with great recovery and variability following amnioinfusion @0930 . Terb x1 given for continued contractions and phenylephrine x2 given for placental perfusion given soft BPs from baseline. Now with minimal variability with late decels. 8.5 cm, -2 station. Cervix easily reduced with pushing but very little pushing effort. Still a few hours to delivery. I advised delivery via c-section.   The risks of surgery were discussed with the patient including but were not limited to: bleeding which may require transfusion or reoperation; infection which may require antibiotics; injury to bowel, bladder, ureters or other surrounding organs; injury to the fetus; need for additional procedures including hysterectomy in the event of a life-threatening hemorrhage; formation of adhesions; placental abnormalities wth subsequent pregnancies; incisional problems; thromboembolic phenomenon and other postoperative/anesthesia complications.   The patient concurred with the proposed plan, giving informed written consent for the procedures.  Antibiotics ordered.  To OR when ready.   Gerlene Fee, DO OB Fellow, [place] for [place] 06/05/2022, 12:35 PM

## 2022-06-05 NOTE — Progress Notes (Signed)
Dr Lavonia Drafts to bedside in PACU to assess honeycomb dressing. Orders for pressure dressing to be applied over honeycomb dressing. Pressure dressing in place and CDI. Patient transported to [facility] with infant in arms. Vitals stable on arrival. Saline lock remains patent. Handoff completed.

## 2022-06-05 NOTE — Transfer of Care (Signed)
Immediate Anesthesia Transfer of Care Note  Patient: Cassandra Avery  Procedure(s) Performed: CESAREAN SECTION  Patient Location: PACU  Anesthesia Type:Epidural  Level of Consciousness: awake, alert , and oriented  Airway & Oxygen Therapy: Patient Spontanous Breathing  Post-op Assessment: Report given to RN and Post -op Vital signs reviewed and stable  Post vital signs: Reviewed and stable  Last Vitals:  Vitals Value Taken Time  BP 141/72 06/05/22 1430  Temp    Pulse 102 06/05/22 1432  Resp 15 06/05/22 1432  SpO2 100 % 06/05/22 1432  Vitals shown include unvalidated device data.  Last Pain:  Vitals:   06/05/22 1422  TempSrc:   PainSc: 0-No pain         Complications: No notable events documented.

## 2022-06-05 NOTE — Progress Notes (Signed)
Labor Progress Note Cassandra Avery is a 37 y.o. G2P1001 at [redacted]w[redacted]d presented for IOL due to [facility] S: doing well, FB still in place.  O:  BP (!) 148/92   Pulse (!) 105   Temp 98 F (36.7 C) (Oral)   Resp 17   Ht 5\' 2"  (1.575 m)   Wt 106.6 kg   LMP 09/04/2021 (Exact Date)   BMI 42.98 kg/m   EFM: 125bpm, moderate variability, +accels, no decels  Contractions: every 2-3 mins.  CVE: Dilation: 2 Effacement (%): 50 Cervical Position: Posterior Station: -3 Presentation: Vertex Exam by:: MD Ndulae   A&P: 37 y.o. G2P1001 [redacted]w[redacted]d IOL for LGA baby #Labor: Progressing well. S/p cytotec X 3 rounds. FB still in place. Continue to monitor. Recheck when FB out. #Pain: IV fentanyl, family/friend support, epidural when more active #FWB: Cat 1 #GBS negative  Jazminn Pomales MD MPH OB Fellow, [place] for [place] 06/05/2022

## 2022-06-05 NOTE — Progress Notes (Signed)
Assume care of patient in PACU @1430 

## 2022-06-05 NOTE — Progress Notes (Signed)
Terbutaline 0.25mg  given SQ posterior right upper arm per physician.

## 2022-06-06 ENCOUNTER — Encounter (HOSPITAL_COMMUNITY): Payer: Self-pay | Admitting: Obstetrics & Gynecology

## 2022-06-06 ENCOUNTER — Encounter: Payer: Self-pay | Admitting: Obstetrics and Gynecology

## 2022-06-06 LAB — CBC
HCT: 24.3 % — ABNORMAL LOW (ref 36.0–46.0)
Hemoglobin: 7.5 g/dL — ABNORMAL LOW (ref 12.0–15.0)
MCH: 26 pg (ref 26.0–34.0)
MCHC: 30.9 g/dL (ref 30.0–36.0)
MCV: 84.4 fL (ref 80.0–100.0)
Platelets: 236 10*3/uL (ref 150–400)
RBC: 2.88 MIL/uL — ABNORMAL LOW (ref 3.87–5.11)
RDW: 14.9 % (ref 11.5–15.5)
WBC: 16.2 10*3/uL — ABNORMAL HIGH (ref 4.0–10.5)
nRBC: 0 % (ref 0.0–0.2)

## 2022-06-06 MED ORDER — SODIUM CHLORIDE 0.9 % IV SOLN
500.0000 mg | Freq: Once | INTRAVENOUS | Status: AC
  Administered 2022-06-06: 500 mg via INTRAVENOUS
  Filled 2022-06-06: qty 460

## 2022-06-06 NOTE — Lactation Note (Signed)
This note was copied from a baby's chart. Lactation Consultation Note  Patient Name: Cassandra Avery [id number] Date: 06/06/2022 Reason for consult: Follow-up assessment;Term Age:35 hours   P2: Term infant at 39+1 weeks Feeding preference: Breast Weight loss: 2%  "[name]" was swaddled and asleep when I arrived.  He has not yet latched and fed well today; received a circumcision earlier today.  Offered to assist with waking and latching; mother receptive.  Removed his clothing and emphasized the importance of feeding STS.  Reviewed hand expression; few drops finger fed to "[name]."  Awakened him and attempted to latch, however, he was not at all interested in opening his mouth; multiple attempts tried.  Suggested mother begin pumping with the electric pump for breast stimulation.  Pump parts, assembly and cleaning reviewed.  #24 flange is appropriate at this time.  After 15 minutes of pumping she was able to obtain a couple of drops which I finger fed to "[name]."  Discussed using expressed colostrum for nipple care.  Encouraged to continue lots of STS, breast massage and hand expression.  Advised mother to begin supplementation after the next feeding if "[name]" does not latch and feed.  Options presented; mother willing.  Mother has a wireless pump at home that she purchased.  Stork pump form scanned for a Medela pump; will follow up with this.  RN notified.  Father present. Maternal Data      Feeding Mother's Current Feeding Choice: Breast Milk  LATCH Score Latch: Too sleepy or reluctant, no latch achieved, no sucking elicited.  Audible Swallowing: None  Type of Nipple: Everted at rest and after stimulation  Comfort (Breast/Nipple): Soft / non-tender  Hold (Positioning): Assistance needed to correctly position infant at breast and maintain latch.  LATCH Score: 5   Lactation Tools Discussed/Used Tools: Pump;Flanges Flange Size: 24 Breast pump type: Double-Electric Breast  Pump;Manual Pump Education: Setup, frequency, and cleaning;Milk Storage Reason for Pumping: Breast stimulation; baby not feeding well Pumping frequency: Every three hours Pumped volume:  (Few drops)  Interventions Interventions: Breast feeding basics reviewed;Assisted with latch;Skin to skin;Breast massage;Hand express;Breast compression;Hand pump;Expressed milk;Position options;Support pillows;Adjust position;DEBP;Education  Discharge Pump: Hands Free;Stork Pump (Stork pump referral form scanned)  Consult Status Consult Status: Follow-up Date: 06/07/22 Follow-up type: In-patient    Little Ishikawa 06/06/2022, 2:46 PM

## 2022-06-06 NOTE — Progress Notes (Signed)
POSTPARTUM PROGRESS NOTE  POD #1  Subjective:  Cassandra Avery is a 37 y.o. G2P2002 s/p pLTCS at [redacted]w[redacted]d.  She reports she doing well. No acute events overnight. She reports she is doing well. She denies any problems with ambulating, voiding or po intake. Denies nausea or vomiting. She has passed flatus. Pain is well controlled.  Lochia is appropriate.  Objective: Blood pressure 114/68, pulse 67, temperature 98.1 F (36.7 C), temperature source Oral, resp. rate 18, height 5\' 2"  (1.575 m), weight 106.6 kg, last menstrual period 09/04/2021, SpO2 100 %, unknown if currently breastfeeding.  Physical Exam:  General: alert, cooperative and no distress Chest: no respiratory distress Heart:regular rate, distal pulses intact Abdomen: soft, nontender,  Uterine Fundus: firm, appropriately tender DVT Evaluation: No calf swelling or tenderness Extremities: trace edema Skin: warm, dry; incision clean/dry/intact w/ honeycomb dressing in place  Recent Labs    06/04/22 0809 06/06/22 0454  HGB 9.5* 7.5*  HCT 31.4* 24.3*    Assessment/Plan: Cassandra Avery is a 37 y.o. G2P2002 s/p pLTCS at [redacted]w[redacted]d for NRFHT.  POD#1 - Doing welll; pain well controlled. H/H appropriate  Routine postpartum care  OOB, ambulated  Lovenox for VTE prophylaxis Anemia: asymptomatic  Venofer ordered   Contraception: Declines Feeding: both  Dispo: Plan for discharge tomorrow or the next day.   LOS: 2 days   Gifford Shave, MD  OB Fellow  06/06/2022, 7:52 AM

## 2022-06-07 ENCOUNTER — Other Ambulatory Visit (HOSPITAL_COMMUNITY): Payer: Self-pay

## 2022-06-07 ENCOUNTER — Encounter: Payer: Medicaid Other | Admitting: Obstetrics and Gynecology

## 2022-06-07 MED ORDER — ACETAMINOPHEN 500 MG PO TABS
1000.0000 mg | ORAL_TABLET | Freq: Four times a day (QID) | ORAL | 0 refills | Status: AC
  Filled 2022-06-07: qty 30, 4d supply, fill #0

## 2022-06-07 MED ORDER — OXYCODONE HCL 5 MG PO TABS
5.0000 mg | ORAL_TABLET | ORAL | 0 refills | Status: AC | PRN
  Filled 2022-06-07: qty 20, 4d supply, fill #0

## 2022-06-07 MED ORDER — GABAPENTIN 100 MG PO CAPS
100.0000 mg | ORAL_CAPSULE | Freq: Two times a day (BID) | ORAL | 0 refills | Status: AC | PRN
  Filled 2022-06-07: qty 30, 15d supply, fill #0

## 2022-06-07 MED ORDER — IBUPROFEN 600 MG PO TABS
600.0000 mg | ORAL_TABLET | Freq: Four times a day (QID) | ORAL | 0 refills | Status: AC
  Filled 2022-06-07: qty 30, 8d supply, fill #0

## 2022-06-07 NOTE — Lactation Note (Signed)
This note was copied from a baby's chart. Lactation Consultation Note  Patient Name: Cassandra Avery Date: 06/07/2022 Reason for consult: Follow-up assessment Age:35 hours   P2: Term infant at 39+1 weeks Feeding preference: Breast/formula Weight loss: 4%  "Cassandra Avery" was clothed and asleep when I arrived.  Family has received a discharge order.  Mother reported that she is continuing to breast feed, however, she has started supplementation because "Cassandra Avery" has not been satisfied with colostrum.  Mother had no questions/concerns related to breast feeding.  Baby is voiding/stooling.  Mother has still not received her Stork pump.  Form scanned yesterday with verification that they received it.  Called again this morning and it was still being processed.  Receptionist assured me that it was planned for delivery soon.  Updated parents.  Family has our op phone number for any questions/concerns after discharge.   Maternal Data    Feeding    LATCH Score                    Lactation Tools Discussed/Used    Interventions    Discharge Discharge Education: Engorgement and breast care Pump: Hands Free;Stork Pump (Called and verified; carrier delivering pump soon to the hospital)  Consult Status Consult Status: Complete Date: 06/07/22 Follow-up type: Call as needed    Kaysan Peixoto R Plato [place] 06/07/2022, 10:46 AM

## 2022-06-11 ENCOUNTER — Telehealth: Payer: Managed Care, Other (non HMO)

## 2022-06-11 DIAGNOSIS — R600 Localized edema: Secondary | ICD-10-CM | POA: Diagnosis not present

## 2022-06-11 NOTE — Patient Instructions (Signed)
  [name], thank you for joining [name], NP for today's virtual visit.  While this provider is not your primary care provider (PCP), if your PCP is located in our provider database this encounter information will be shared with them immediately following your visit.   [facility] account gives you access to today's visit and all your visits, tests, and labs performed at [facility] " click here if you don't have a [facility] account or go to [email or web address]  Consent: (Patient) Cassandra Avery provided verbal consent for this virtual visit at the beginning of the encounter.  Current Medications:  Current Outpatient Medications:    acetaminophen (TYLENOL) 500 MG tablet, Take 2 tablets (1,000 mg total) by mouth every 6 (six) hours., Disp: 30 tablet, Rfl: 0   ferrous sulfate (FERROUSUL) 325 (65 FE) MG tablet, Take 1 tablet (325 mg total) by mouth every other day., Disp: 30 tablet, Rfl: 5   gabapentin (NEURONTIN) 100 MG capsule, Take 1 capsule (100 mg total) by mouth 2 (two) times daily as needed (pain)., Disp: 30 capsule, Rfl: 0   ibuprofen (ADVIL) 600 MG tablet, Take 1 tablet (600 mg total) by mouth every 6 (six) hours., Disp: 30 tablet, Rfl: 0   oxyCODONE (OXY IR/ROXICODONE) 5 MG immediate release tablet, Take 1 tablet (5 mg total) by mouth every 4 (four) hours as needed for moderate pain., Disp: 20 tablet, Rfl: 0   pantoprazole (PROTONIX) 20 MG tablet, Take 1 tablet (20 mg total) by mouth 2 (two) times daily., Disp: 30 tablet, Rfl: 1   Prenatal MV-Min-FA-Omega-3 (PRENATAL GUMMIES/DHA & FA) 0.4-32.5 MG CHEW, Chew 2 each by mouth at bedtime., Disp: , Rfl:    Medications ordered in this encounter:  No orders of the defined types were placed in this encounter.    *If you need refills on other medications prior to your next appointment, please contact your pharmacy*  Follow-Up: Call back or seek an in-person evaluation if the symptoms worsen or if  the condition fails to improve as anticipated.  [place] [phone]  Other Instructions Although swelling in your legs and feet is an expected postpartum symptom, due to your report of increased swelling, numbness and tingling in the left thigh you will need to be evaluated in person at an urgent care or emergency room.   If you have been instructed to have an in-person evaluation today at a local Urgent Care facility, please use the link below. It will take you to a list of all of our available [facility], including address, phone number and hours of operation. Please do not delay care.  [facility]  If you or a family member do not have a primary care provider, use the link below to schedule a visit and establish care. When you choose a [facility] primary care physician or advanced practice provider, you gain a long-term partner in health. Find a Primary Care Provider  Learn more about [facility]'s in-office and virtual care options: [facility]

## 2022-06-11 NOTE — Progress Notes (Signed)
Virtual Visit Consent   Cassandra Avery, you are scheduled for a virtual visit with a [facility] provider today. Just as with appointments in the office, your consent must be obtained to participate. Your consent will be active for this visit and any virtual visit you may have with one of our providers in the next 365 days. If you have a MyChart account, a copy of this consent can be sent to you electronically.  As this is a virtual visit, video technology does not allow for your provider to perform a traditional examination. This may limit your provider's ability to fully assess your condition. If your provider identifies any concerns that need to be evaluated in person or the need to arrange testing (such as labs, EKG, etc.), we will make arrangements to do so. Although advances in technology are sophisticated, we cannot ensure that it will always work on either your end or our end. If the connection with a video visit is poor, the visit may have to be switched to a telephone visit. With either a video or telephone visit, we are not always able to ensure that we have a secure connection.  By engaging in this virtual visit, you consent to the provision of healthcare and authorize for your insurance to be billed (if applicable) for the services provided during this visit. Depending on your insurance coverage, you may receive a charge related to this service.  I need to obtain your verbal consent now. Are you willing to proceed with your visit today? Goldye Tourangeau has provided verbal consent on 06/11/2022 for a virtual visit (video or telephone). [name], NP  Date: 06/11/2022 8:36 AM  Virtual Visit via Video Note   I, [name], connected with  Cassandra Avery  ([id number], Dec 31, 1985) on 06/11/22 at  8:30 AM EST by a video-enabled telemedicine application and verified that I am speaking with the correct person using two identifiers.  Location: Patient: Virtual Visit Location Patient:  Home Provider: Virtual Visit Location Provider: Home Office   I discussed the limitations of evaluation and management by telemedicine and the availability of in person appointments. The patient expressed understanding and agreed to proceed.    History of Present Illness: Cassandra Avery is a 37 y.o. who identifies as a female who was assigned female at birth, and is being seen today for postpartum complications.  [name] had a C-section 6 days ago and is currently experiencing bilateral lower extremity edema and left thigh numbness tingling with increased swelling.  She does not endorse chest pain, worsening shortness of breath, or elevated blood pressure readings.  Reports her blood pressures have been normal.  She has not called the on-call OB/GYN.  She is drinking plenty of fluids as expected after delivery.  States her left thigh has increased swelling and is at least twice the size of the right thigh.   Problems:  Patient Active Problem List   Diagnosis Date Noted   S/P primary low transverse C-section 06/05/2022   BMI 40.0-44.9, adult ([place]) 05/17/2022   History of gestational hypertension 11/23/2021   Supervision of other normal pregnancy, antepartum 11/02/2021    Allergies: No Known Allergies Medications:  Current Outpatient Medications:    acetaminophen (TYLENOL) 500 MG tablet, Take 2 tablets (1,000 mg total) by mouth every 6 (six) hours., Disp: 30 tablet, Rfl: 0   ferrous sulfate (FERROUSUL) 325 (65 FE) MG tablet, Take 1 tablet (325 mg total) by mouth every other day., Disp: 30 tablet, Rfl: 5  gabapentin (NEURONTIN) 100 MG capsule, Take 1 capsule (100 mg total) by mouth 2 (two) times daily as needed (pain)., Disp: 30 capsule, Rfl: 0   ibuprofen (ADVIL) 600 MG tablet, Take 1 tablet (600 mg total) by mouth every 6 (six) hours., Disp: 30 tablet, Rfl: 0   oxyCODONE (OXY IR/ROXICODONE) 5 MG immediate release tablet, Take 1 tablet (5 mg total) by mouth every 4 (four) hours as needed for  moderate pain., Disp: 20 tablet, Rfl: 0   pantoprazole (PROTONIX) 20 MG tablet, Take 1 tablet (20 mg total) by mouth 2 (two) times daily., Disp: 30 tablet, Rfl: 1   Prenatal MV-Min-FA-Omega-3 (PRENATAL GUMMIES/DHA & FA) 0.4-32.5 MG CHEW, Chew 2 each by mouth at bedtime., Disp: , Rfl:   Observations/Objective: Patient is well-developed, well-nourished in no acute distress.  Resting comfortably  at home.  Head is normocephalic, atraumatic.  No labored breathing.  Speech is clear and coherent with logical content.  Patient is alert and oriented at baseline.    Assessment and Plan: 1. Fluid retention in legs Although swelling in your legs and feet is an expected postpartum symptom, due to your report of increased swelling, numbness and tingling in the left thigh you will need to be evaluated in person at an urgent care or emergency room.  Follow Up Instructions: I discussed the assessment and treatment plan with the patient. The patient was provided an opportunity to ask questions and all were answered. The patient agreed with the plan and demonstrated an understanding of the instructions.  A copy of instructions were sent to the patient via MyChart unless otherwise noted below.   The patient was advised to call back or seek an in-person evaluation if the symptoms worsen or if the condition fails to improve as anticipated.  Time:  I spent 11 minutes with the patient via telehealth technology discussing the above problems/concerns.    [name], NP

## 2022-06-13 ENCOUNTER — Inpatient Hospital Stay (HOSPITAL_COMMUNITY)
Admission: AD | Admit: 2022-06-13 | Discharge: 2022-06-13 | Disposition: A | Discharge status: Home / Self Care | Payer: Managed Care, Other (non HMO) | Attending: Family Medicine | Admitting: Family Medicine

## 2022-06-13 ENCOUNTER — Encounter (HOSPITAL_COMMUNITY): Payer: Self-pay | Admitting: Obstetrics & Gynecology

## 2022-06-13 ENCOUNTER — Ambulatory Visit (INDEPENDENT_AMBULATORY_CARE_PROVIDER_SITE_OTHER): Payer: Managed Care, Other (non HMO)

## 2022-06-13 VITALS — BP 162/93 | HR 90 | Wt 240.0 lb

## 2022-06-13 DIAGNOSIS — I10 Essential (primary) hypertension: Secondary | ICD-10-CM | POA: Diagnosis not present

## 2022-06-13 DIAGNOSIS — O1205 Gestational edema, complicating the puerperium: Secondary | ICD-10-CM | POA: Diagnosis not present

## 2022-06-13 DIAGNOSIS — O165 Unspecified maternal hypertension, complicating the puerperium: Secondary | ICD-10-CM | POA: Insufficient documentation

## 2022-06-13 DIAGNOSIS — O99893 Other specified diseases and conditions complicating puerperium: Secondary | ICD-10-CM | POA: Insufficient documentation

## 2022-06-13 DIAGNOSIS — Z98891 History of uterine scar from previous surgery: Secondary | ICD-10-CM

## 2022-06-13 LAB — COMPREHENSIVE METABOLIC PANEL
ALT: 26 U/L (ref 0–44)
AST: 19 U/L (ref 15–41)
Albumin: 2.6 g/dL — ABNORMAL LOW (ref 3.5–5.0)
Alkaline Phosphatase: 106 U/L (ref 38–126)
Anion gap: 10 (ref 5–15)
BUN: 5 mg/dL — ABNORMAL LOW (ref 6–20)
CO2: 23 mmol/L (ref 22–32)
Calcium: 8.8 mg/dL — ABNORMAL LOW (ref 8.9–10.3)
Chloride: 107 mmol/L (ref 98–111)
Creatinine, Ser: 0.64 mg/dL (ref 0.44–1.00)
GFR, Estimated: >60 mL/min (ref >60)
Glucose, Bld: 84 mg/dL (ref 70–99)
Potassium: 3.8 mmol/L (ref 3.5–5.1)
Sodium: 140 mmol/L (ref 135–145)
Total Bilirubin: 0.2 mg/dL — ABNORMAL LOW (ref 0.3–1.2)
Total Protein: 5.5 g/dL — ABNORMAL LOW (ref 6.5–8.1)

## 2022-06-13 LAB — CBC
HCT: 25.9 % — ABNORMAL LOW (ref 36.0–46.0)
Hemoglobin: 7.6 g/dL — ABNORMAL LOW (ref 12.0–15.0)
MCH: 26 pg (ref 26.0–34.0)
MCHC: 29.3 g/dL — ABNORMAL LOW (ref 30.0–36.0)
MCV: 88.7 fL (ref 80.0–100.0)
Platelets: 381 10*3/uL (ref 150–400)
RBC: 2.92 MIL/uL — ABNORMAL LOW (ref 3.87–5.11)
RDW: 17.7 % — ABNORMAL HIGH (ref 11.5–15.5)
WBC: 6.7 10*3/uL (ref 4.0–10.5)
nRBC: 0 % (ref 0.0–0.2)

## 2022-06-13 MED ORDER — NIFEDIPINE ER OSMOTIC RELEASE 30 MG PO TB24: 30.0000 mg | ORAL_TABLET | Freq: Every day | ORAL | 1 refills | Status: AC

## 2022-06-13 MED ORDER — FUROSEMIDE 20 MG PO TABS: 20.0000 mg | ORAL_TABLET | Freq: Two times a day (BID) | ORAL | 0 refills | Status: AC

## 2022-06-13 NOTE — Progress Notes (Signed)
Subjective:     Cassandra Avery is a 37 y.o. female who presents to the clinic 1 weeks status post low uterine, transverse cesarean section. Pt reports incision is healing well. Pain 5/10 Pt reports HA's, denies visual disturbances.     Objective:    BP (!) 162/93   Pulse 90   Wt 240 lb (108.9 kg)   Breastfeeding Yes   BMI 43.90 kg/m  General:  alert, well appearing  Incision:   healing well, no drainage, no erythema     Assessment:    Postoperative course complicated by Elevated BP x 2 with Headaches   Plan:   Sent to MAU to rule out pre-eclampsia  1. Continue any current medications. 2. Wound care discussed. 3. Follow up: 07/18/22.   Maryruth Eve, CMA

## 2022-06-13 NOTE — MAU Note (Addendum)
...  Cassandra Avery is a 37 y.o. at Unknown here in MAU reporting: Sent over from the office for blood pressure evaluation. She reports she has also been experiencing HA's off and on for the past three days. She reports they go away with medication but always return. Denies current HA. Took 600 mg of Ibuprofen earlier this morning, unable to recall when. She reports she is also concerned about the swelling in her legs and feet. She reports her swelling began this past Thursday and has continued to get worse. She reports the swelling is accompanied by tingling in her feet as well as occasional sharp pains. She reports she is also experiencing numbness in her left thigh.   Last took Gabapentin on 2/3. Reports it did not help with any of her pains in her legs. She reports she is unsure why they prescribed it to her and she states she only knew it was for pain because of the bottle.   Pain score: No current HA.  9/10 bilateral legs and feet - worse with walking

## 2022-06-13 NOTE — MAU Provider Note (Signed)
History     CSN: [id number]  Arrival date and time: 06/13/22 1129     Chief Complaint  Patient presents with   Leg Swelling   Foot Swelling   Leg Numbness   Hypertension   Headache   HPI This is a 37 year old G2 P2-0-0-2 who is approximately 10 days status post cesarean delivery.  She was seen in the office for wound check.  There, she had a blood pressure that was 160/93 and was sent here for evaluation.  She has been experiencing severe edema to her lower extremities, as well as numbness in the lateral and anterior part of her left thigh.  She denies blurred vision, headache, abdominal pain.  She is currently breast-feeding and bottlefeeding.  OB History     Gravida  2   Para  2   Term  2   Preterm  0   AB  0   Living  2      SAB  0   IAB  0   Ectopic  0   Multiple  0   Live Births  2           Past Medical History:  Diagnosis Date   Gestational hypertension 01/26/2017   Headache    Recurrent upper respiratory infection (URI)     Past Surgical History:  Procedure Laterality Date   CESAREAN SECTION N/A 06/05/2022   Procedure: CESAREAN SECTION;  Surgeon: Florian Buff, MD;  Location: [facility];  Service: Obstetrics;  Laterality: N/A;   NO PAST SURGERIES      Family History  Problem Relation Age of Onset   37 / Stillbirths Mother    Stroke Father    Asthma Sister    Stroke Paternal Grandmother    Hearing loss Paternal Grandfather     Social History   Tobacco Use   Smoking status: Never    Passive exposure: Never   Smokeless tobacco: Never  Vaping Use   Vaping Use: Never used  Substance Use Topics   Alcohol use: Not Currently    Comment: socially, prior to pregnancy   Drug use: No    Allergies: No Known Allergies  Medications Prior to Admission  Medication Sig Dispense Refill Last Dose   gabapentin (NEURONTIN) 100 MG capsule Take 1 capsule (100 mg total) by mouth 2 (two) times daily as needed (pain). 30 capsule 0 Past  Week at 1600   ibuprofen (ADVIL) 600 MG tablet Take 1 tablet (600 mg total) by mouth every 6 (six) hours. 30 tablet 0 06/13/2022   acetaminophen (TYLENOL) 500 MG tablet Take 2 tablets (1,000 mg total) by mouth every 6 (six) hours. 30 tablet 0    ferrous sulfate (FERROUSUL) 325 (65 FE) MG tablet Take 1 tablet (325 mg total) by mouth every other day. 30 tablet 5    oxyCODONE (OXY IR/ROXICODONE) 5 MG immediate release tablet Take 1 tablet (5 mg total) by mouth every 4 (four) hours as needed for moderate pain. 20 tablet 0    pantoprazole (PROTONIX) 20 MG tablet Take 1 tablet (20 mg total) by mouth 2 (two) times daily. (Patient not taking: Reported on 06/13/2022) 30 tablet 1    Prenatal MV-Min-FA-Omega-3 (PRENATAL GUMMIES/DHA & FA) 0.4-32.5 MG CHEW Chew 2 each by mouth at bedtime.       Review of Systems Physical Exam   Blood pressure 130/72, pulse 71, temperature 98.9 F (37.2 C), temperature source Oral, resp. rate 15, height 5\' 2"  (1.575 m), weight 109  kg, SpO2 99 %, currently breastfeeding.  Physical Exam Vitals and nursing note reviewed.  Constitutional:      Appearance: She is well-developed.  Cardiovascular:     Rate and Rhythm: Normal rate and regular rhythm.  Pulmonary:     Effort: Pulmonary effort is normal.     Breath sounds: Normal breath sounds.  Abdominal:     Palpations: Abdomen is soft.  Neurological:     Mental Status: She is alert.     Comments: Numbness along the lateral cutaneous nerve of the thigh on the left side.  Psychiatric:        Mood and Affect: Mood normal.        Speech: Speech normal.        Behavior: Behavior normal.    Results for orders placed or performed during the hospital encounter of 06/13/22 (from the past 24 hour(s))  CBC     Status: Abnormal   Collection Time: 06/13/22 12:06 PM  Result Value Ref Range   WBC 6.7 4.0 - 10.5 K/uL   RBC 2.92 (L) 3.87 - 5.11 MIL/uL   Hemoglobin 7.6 (L) 12.0 - 15.0 g/dL   HCT 25.9 (L) 36.0 - 46.0 %   MCV 88.7 80.0  - 100.0 fL   MCH 26.0 26.0 - 34.0 pg   MCHC 29.3 (L) 30.0 - 36.0 g/dL   RDW 17.7 (H) 11.5 - 15.5 %   Platelets 381 150 - 400 K/uL   nRBC 0.0 0.0 - 0.2 %  Comprehensive metabolic panel     Status: Abnormal   Collection Time: 06/13/22 12:06 PM  Result Value Ref Range   Sodium 140 135 - 145 mmol/L   Potassium 3.8 3.5 - 5.1 mmol/L   Chloride 107 98 - 111 mmol/L   CO2 23 22 - 32 mmol/L   Glucose, Bld 84 70 - 99 mg/dL   BUN 5 (L) 6 - 20 mg/dL   Creatinine, Ser 0.64 0.44 - 1.00 mg/dL   Calcium 8.8 (L) 8.9 - 10.3 mg/dL   Total Protein 5.5 (L) 6.5 - 8.1 g/dL   Albumin 2.6 (L) 3.5 - 5.0 g/dL   AST 19 15 - 41 U/L   ALT 26 0 - 44 U/L   Alkaline Phosphatase 106 38 - 126 U/L   Total Bilirubin 0.2 (L) 0.3 - 1.2 mg/dL   GFR, Estimated >60 >60 mL/min   Anion gap 10 5 - 15     MAU Course  Procedures  MDM  Assessment and Plan   1. Postpartum hypertension   2. S/P primary low transverse C-section [Z98.891]    Labs normal Start lasix and BP medication Discussed how to take medications. F/u in office next week. Return here for any emergencies.  Truett Mainland 06/13/2022, 12:35 PM

## 2022-06-20 ENCOUNTER — Ambulatory Visit: Payer: Managed Care, Other (non HMO) | Admitting: Emergency Medicine

## 2022-06-20 ENCOUNTER — Encounter: Payer: Self-pay | Admitting: Emergency Medicine

## 2022-06-20 VITALS — BP 149/87 | HR 65 | Wt 217.7 lb

## 2022-06-20 DIAGNOSIS — Z8759 Personal history of other complications of pregnancy, childbirth and the puerperium: Secondary | ICD-10-CM

## 2022-06-20 NOTE — Progress Notes (Signed)
Patient presents for PP BP check.   Subjective:  Cassandra Avery is a 37 y.o. female here for BP check.   Hypertension ROS: taking medications as instructed, no medication side effects noted, no TIA's, no chest pain on exertion, no dyspnea on exertion, and no swelling of ankles.    Objective:  There were no vitals taken for this visit.  Appearance alert, well appearing, and in no distress. General exam BP noted to be well controlled today in office.    Assessment:   Blood Pressure needs improvement.   Plan:  Increase Nifedipine 81m to twice daily.

## 2022-06-23 ENCOUNTER — Telehealth: Payer: Self-pay | Admitting: Emergency Medicine

## 2022-06-23 NOTE — Telephone Encounter (Signed)
Attempted TC to patient to discuss FMLA pprwork. LVM

## 2022-06-28 ENCOUNTER — Telehealth: Payer: Self-pay

## 2022-06-28 NOTE — Telephone Encounter (Signed)
TC to patient to discuss dates for [facility] paperwork.  No answer. Left message on vm for patient to return call to the office.

## 2022-07-05 ENCOUNTER — Telehealth: Payer: Self-pay | Admitting: *Deleted

## 2022-07-05 NOTE — Telephone Encounter (Signed)
Following 3rd call and VM regarding completion of FMLA forms, pt called the office and gave needed information.

## 2022-07-07 ENCOUNTER — Ambulatory Visit (INDEPENDENT_AMBULATORY_CARE_PROVIDER_SITE_OTHER): Payer: Managed Care, Other (non HMO)

## 2022-07-07 VITALS — BP 142/86 | HR 62 | Wt 214.0 lb

## 2022-07-07 DIAGNOSIS — Z5189 Encounter for other specified aftercare: Secondary | ICD-10-CM

## 2022-07-07 DIAGNOSIS — O8601 Infection of obstetric surgical wound, superficial incisional site: Secondary | ICD-10-CM

## 2022-07-07 NOTE — Progress Notes (Signed)
Patient presents for wound check post c-section 4 weeks ago. Patient states that she has been noticing some drainage on her ABD dressing. Patient states that she had a steri strips placed on her incision check on 2/12 due to an a small area opening up. Patient states that she has not noticed an odor, but is still concerned about the drainage. Denies increased pain outside of the expected soreness, no fever, no chills, no body aches, and no bleeding coming from the area. Describes drainage as reddish brown. No other concerns.   Patient has elevated BP today in office as well. Patient states that she has not taker her BP pressure medication today.   Upon assessment of wound, two steri strips where still in place. I removed steri strips to assess wound. Odor was noticed with removal of steri strips. There was a small area that was red and raised.  Consulted with Dr. Roselie Awkward to assess wound. Per Dr. Roselie Awkward area appears to be degranulation tissue. Area covered with ABD ointment.  Patient advised to monitor for signs of infection and when to seek evaluation.  PP visit scheduled on 3/11.  Patient verbalized understanding.

## 2022-07-18 ENCOUNTER — Encounter: Payer: Self-pay | Admitting: Obstetrics & Gynecology

## 2022-07-18 ENCOUNTER — Ambulatory Visit: Payer: Managed Care, Other (non HMO) | Admitting: Obstetrics & Gynecology

## 2022-07-18 DIAGNOSIS — M549 Dorsalgia, unspecified: Secondary | ICD-10-CM | POA: Diagnosis not present

## 2022-07-18 NOTE — Progress Notes (Signed)
Post Partum Visit Note  Cassandra Avery is a 37 y.o. G36P2002 female who presents for a postpartum visit. She is 6 weeks postpartum following a primary cesarean section.  I have fully reviewed the prenatal and intrapartum course. The delivery was at 24w1dgestational weeks.  Anesthesia: epidural. Postpartum course has been unremarkable. Baby is doing well. Baby is feeding by both breast and bottle - [employer] . Bleeding no bleeding. Bowel function is . normalBladder function is normal. Patient is not sexually active. Contraception method is none. Postpartum depression screening: negative.   The pregnancy intention screening data noted above was reviewed. Potential methods of contraception were discussed. The patient elected to proceed with No data recorded.   Edinburgh Postnatal Depression Scale - 07/18/22 1323       Edinburgh Postnatal Depression Scale:  In the Past 7 Days   I have been able to laugh and see the funny side of things. 0    I have looked forward with enjoyment to things. 0    I have blamed myself unnecessarily when things went wrong. 0    I have been anxious or worried for no good reason. 0    I have felt scared or panicky for no good reason. 0    Things have been getting on top of me. 0    I have been so unhappy that I have had difficulty sleeping. 0    I have felt sad or miserable. 0    I have been so unhappy that I have been crying. 0    The thought of harming myself has occurred to me. 0    Edinburgh Postnatal Depression Scale Total 0             Health Maintenance Due  Topic Date Due   COVID-19 Vaccine (1) Never done    The following portions of the patient's history were reviewed and updated as appropriate: allergies, current medications, past family history, past medical history, past social history, past surgical history, and problem list.  Review of Systems Musculoskeletal:positive for back pain  Objective:  Wt 97.3 kg   BMI 39.21  kg/m    General:  alert, cooperative, and no distress   Breasts:  not indicated  Lungs:   Heart:  regular rate and rhythm  Abdomen: soft, non-tender; bowel sounds normal; no masses,  no organomegaly   Wound well approximated incision  GU exam:  not indicated       Assessment:   Encounter for postpartum visit [Z39.2] - Plan: Ambulatory referral to Physical Therapy  Back pain, unspecified back location, unspecified back pain laterality, unspecified chronicity - Plan: Ambulatory referral to Physical Therapy   normal postpartum exam.   Plan:   Essential components of care per ACOG recommendations:  1.  Mood and well being: Patient with negative depression screening today. Reviewed local resources for support.  - Patient tobacco use? No.   - hx of drug use? No.    2. Infant care and feeding:  -Patient currently breastmilk feeding? Yes. Discussed returning to work and pumping.  -[place] of [place] concerns  3. Sexuality, contraception and birth spacing - Patient does not want a pregnancy in the next year.  Desired family size is 2 children.  - Reviewed reproductive life planning. Reviewed contraceptive methods based on pt preferences and effectiveness.  Patient desired Withdrawal or Other Method today.   - Discussed birth spacing of 18 months  4. Sleep  and fatigue -Encouraged family/partner/community support of 4 hrs of uninterrupted sleep to help with mood and fatigue  5. Physical Recovery  - Discussed patients delivery and complications. She describes her labor as mixed. - Patient had a C-section failure to progress. - Patient has urinary incontinence? No. - Patient is safe to resume physical and sexual activity  6.  Health Maintenance - HM due items addressed Yes - Last pap smear  Diagnosis  Date Value Ref Range Status  11/23/2021   Final   - Negative for intraepithelial lesion or malignancy (NILM)   Pap smear not done at today's  visit.  -Breast Cancer screening indicated? No.   7. Chronic Disease/Pregnancy Condition follow up:  PT for MS back pain  - PCP follow up  Emeterio Reeve, [place] for [place], [place]

## 2022-08-25 ENCOUNTER — Encounter: Payer: Self-pay | Admitting: Obstetrics & Gynecology
